# Patient Record
Sex: Female | Born: 1975 | Race: Black or African American | Hispanic: No | Smoking: Current every day smoker
Health system: Southern US, Community
[De-identification: ages and names within clinical notes are randomized; demographics above are authoritative.]

## PROBLEM LIST (undated history)

## (undated) DIAGNOSIS — B1920 Unspecified viral hepatitis C without hepatic coma: Secondary | ICD-10-CM

## (undated) DIAGNOSIS — G4730 Sleep apnea, unspecified: Secondary | ICD-10-CM

## (undated) DIAGNOSIS — F319 Bipolar disorder, unspecified: Secondary | ICD-10-CM

## (undated) DIAGNOSIS — G43909 Migraine, unspecified, not intractable, without status migrainosus: Secondary | ICD-10-CM

## (undated) HISTORY — PX: BACK SURGERY: SHX140

## (undated) HISTORY — DX: Sleep apnea, unspecified: G47.30

## (undated) HISTORY — DX: Bipolar disorder, unspecified: F31.9

## (undated) HISTORY — PX: LEG SURGERY: SHX1003

## (undated) HISTORY — DX: Unspecified viral hepatitis C without hepatic coma: B19.20

## (undated) HISTORY — DX: Migraine, unspecified, not intractable, without status migrainosus: G43.909

---

## 1999-07-27 ENCOUNTER — Emergency Department (HOSPITAL_COMMUNITY): Admission: EM | Admit: 1999-07-27 | Discharge: 1999-07-27 | Payer: Self-pay | Admitting: Emergency Medicine

## 2000-09-29 ENCOUNTER — Emergency Department (HOSPITAL_COMMUNITY): Admission: EM | Admit: 2000-09-29 | Discharge: 2000-09-29 | Payer: Self-pay | Admitting: Emergency Medicine

## 2004-10-25 ENCOUNTER — Emergency Department (HOSPITAL_COMMUNITY): Admission: EM | Admit: 2004-10-25 | Discharge: 2004-10-25 | Payer: Self-pay | Admitting: Emergency Medicine

## 2005-06-25 ENCOUNTER — Emergency Department (HOSPITAL_COMMUNITY): Admission: EM | Admit: 2005-06-25 | Discharge: 2005-06-25 | Payer: Self-pay | Admitting: Emergency Medicine

## 2005-07-02 ENCOUNTER — Emergency Department (HOSPITAL_COMMUNITY): Admission: EM | Admit: 2005-07-02 | Discharge: 2005-07-02 | Payer: Self-pay | Admitting: Emergency Medicine

## 2005-08-15 ENCOUNTER — Emergency Department (HOSPITAL_COMMUNITY): Admission: EM | Admit: 2005-08-15 | Discharge: 2005-08-15 | Payer: Self-pay | Admitting: *Deleted

## 2005-10-15 ENCOUNTER — Emergency Department (HOSPITAL_COMMUNITY): Admission: EM | Admit: 2005-10-15 | Discharge: 2005-10-15 | Payer: Self-pay | Admitting: Emergency Medicine

## 2006-06-19 HISTORY — PX: LEG SURGERY: SHX1003

## 2006-06-19 HISTORY — PX: BACK SURGERY: SHX140

## 2006-11-03 ENCOUNTER — Inpatient Hospital Stay (HOSPITAL_COMMUNITY): Admission: AC | Admit: 2006-11-03 | Discharge: 2006-11-13 | Payer: Self-pay

## 2006-11-13 DIAGNOSIS — S72409A Unspecified fracture of lower end of unspecified femur, initial encounter for closed fracture: Secondary | ICD-10-CM

## 2006-11-13 HISTORY — DX: Unspecified fracture of lower end of unspecified femur, initial encounter for closed fracture: S72.409A

## 2006-12-03 ENCOUNTER — Emergency Department (HOSPITAL_COMMUNITY): Admission: EM | Admit: 2006-12-03 | Discharge: 2006-12-03 | Payer: Self-pay | Admitting: Emergency Medicine

## 2006-12-09 ENCOUNTER — Emergency Department (HOSPITAL_COMMUNITY): Admission: EM | Admit: 2006-12-09 | Discharge: 2006-12-09 | Payer: Self-pay | Admitting: Emergency Medicine

## 2006-12-31 ENCOUNTER — Ambulatory Visit: Payer: Self-pay | Admitting: Internal Medicine

## 2007-01-16 ENCOUNTER — Ambulatory Visit: Payer: Self-pay | Admitting: Internal Medicine

## 2007-01-16 ENCOUNTER — Emergency Department (HOSPITAL_COMMUNITY): Admission: EM | Admit: 2007-01-16 | Discharge: 2007-01-16 | Payer: Self-pay | Admitting: Emergency Medicine

## 2007-02-13 ENCOUNTER — Emergency Department (HOSPITAL_COMMUNITY): Admission: EM | Admit: 2007-02-13 | Discharge: 2007-02-14 | Payer: Self-pay | Admitting: Emergency Medicine

## 2007-03-04 ENCOUNTER — Telehealth (INDEPENDENT_AMBULATORY_CARE_PROVIDER_SITE_OTHER): Payer: Self-pay | Admitting: *Deleted

## 2007-04-29 ENCOUNTER — Emergency Department (HOSPITAL_COMMUNITY): Admission: EM | Admit: 2007-04-29 | Discharge: 2007-04-29 | Payer: Self-pay | Admitting: Emergency Medicine

## 2007-06-04 ENCOUNTER — Telehealth (INDEPENDENT_AMBULATORY_CARE_PROVIDER_SITE_OTHER): Payer: Self-pay | Admitting: *Deleted

## 2007-06-06 ENCOUNTER — Encounter (INDEPENDENT_AMBULATORY_CARE_PROVIDER_SITE_OTHER): Payer: Self-pay | Admitting: Internal Medicine

## 2007-07-11 ENCOUNTER — Emergency Department (HOSPITAL_COMMUNITY): Admission: EM | Admit: 2007-07-11 | Discharge: 2007-07-11 | Payer: Self-pay | Admitting: Emergency Medicine

## 2007-07-12 ENCOUNTER — Ambulatory Visit: Payer: Self-pay | Admitting: Nurse Practitioner

## 2007-07-12 DIAGNOSIS — E669 Obesity, unspecified: Secondary | ICD-10-CM

## 2007-07-12 DIAGNOSIS — K219 Gastro-esophageal reflux disease without esophagitis: Secondary | ICD-10-CM

## 2007-07-12 DIAGNOSIS — F17200 Nicotine dependence, unspecified, uncomplicated: Secondary | ICD-10-CM | POA: Insufficient documentation

## 2007-07-12 DIAGNOSIS — F319 Bipolar disorder, unspecified: Secondary | ICD-10-CM | POA: Insufficient documentation

## 2007-07-12 HISTORY — DX: Gastro-esophageal reflux disease without esophagitis: K21.9

## 2007-07-12 LAB — CONVERTED CEMR LAB
Albumin: 4.3 g/dL (ref 3.5–5.2)
Alkaline Phosphatase: 75 units/L (ref 39–117)
BUN: 10 mg/dL (ref 6–23)
Eosinophils Absolute: 0.1 10*3/uL (ref 0.0–0.7)
Eosinophils Relative: 1 % (ref 0–5)
Glucose, Bld: 76 mg/dL (ref 70–99)
HCT: 41 % (ref 36.0–46.0)
Hemoglobin: 13.4 g/dL (ref 12.0–15.0)
Lymphs Abs: 3.2 10*3/uL (ref 0.7–4.0)
MCV: 88.9 fL (ref 78.0–100.0)
Monocytes Relative: 9 % (ref 3–12)
Potassium: 4.4 meq/L (ref 3.5–5.3)
RBC: 4.61 M/uL (ref 3.87–5.11)
Total Bilirubin: 0.3 mg/dL (ref 0.3–1.2)
WBC: 9.8 10*3/uL (ref 4.0–10.5)

## 2007-07-16 ENCOUNTER — Ambulatory Visit: Payer: Self-pay | Admitting: *Deleted

## 2007-07-16 ENCOUNTER — Encounter (INDEPENDENT_AMBULATORY_CARE_PROVIDER_SITE_OTHER): Payer: Self-pay | Admitting: Nurse Practitioner

## 2007-07-19 ENCOUNTER — Encounter (INDEPENDENT_AMBULATORY_CARE_PROVIDER_SITE_OTHER): Payer: Self-pay | Admitting: Nurse Practitioner

## 2007-08-08 ENCOUNTER — Emergency Department (HOSPITAL_COMMUNITY): Admission: EM | Admit: 2007-08-08 | Discharge: 2007-08-08 | Payer: Self-pay | Admitting: Emergency Medicine

## 2007-08-12 ENCOUNTER — Telehealth (INDEPENDENT_AMBULATORY_CARE_PROVIDER_SITE_OTHER): Payer: Self-pay | Admitting: Nurse Practitioner

## 2007-09-11 ENCOUNTER — Ambulatory Visit: Payer: Self-pay | Admitting: *Deleted
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
## 2007-10-18 LAB — CONVERTED CEMR LAB: Pap Smear: NORMAL
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
## 2008-02-10 ENCOUNTER — Ambulatory Visit: Payer: Self-pay | Admitting: Nurse Practitioner

## 2008-02-10 DIAGNOSIS — I10 Essential (primary) hypertension: Secondary | ICD-10-CM | POA: Insufficient documentation

## 2008-02-10 DIAGNOSIS — K029 Dental caries, unspecified: Secondary | ICD-10-CM | POA: Insufficient documentation

## 2008-02-10 DIAGNOSIS — L659 Nonscarring hair loss, unspecified: Secondary | ICD-10-CM

## 2008-02-10 HISTORY — DX: Dental caries, unspecified: K02.9

## 2008-02-10 HISTORY — DX: Nonscarring hair loss, unspecified: L65.9

## 2008-02-12 ENCOUNTER — Encounter (INDEPENDENT_AMBULATORY_CARE_PROVIDER_SITE_OTHER): Payer: Self-pay | Admitting: Nurse Practitioner

## 2008-03-12 ENCOUNTER — Ambulatory Visit: Payer: Self-pay | Admitting: Nurse Practitioner

## 2008-03-12 DIAGNOSIS — M79609 Pain in unspecified limb: Secondary | ICD-10-CM

## 2008-03-12 HISTORY — DX: Pain in unspecified limb: M79.609

## 2008-03-16 ENCOUNTER — Encounter (INDEPENDENT_AMBULATORY_CARE_PROVIDER_SITE_OTHER): Payer: Self-pay | Admitting: Nurse Practitioner

## 2008-04-07 ENCOUNTER — Telehealth (INDEPENDENT_AMBULATORY_CARE_PROVIDER_SITE_OTHER): Payer: Self-pay | Admitting: Nurse Practitioner

## 2008-05-11 ENCOUNTER — Telehealth (INDEPENDENT_AMBULATORY_CARE_PROVIDER_SITE_OTHER): Payer: Self-pay | Admitting: Nurse Practitioner

## 2008-06-08 ENCOUNTER — Telehealth (INDEPENDENT_AMBULATORY_CARE_PROVIDER_SITE_OTHER): Payer: Self-pay | Admitting: Nurse Practitioner

## 2008-06-15 ENCOUNTER — Encounter (INDEPENDENT_AMBULATORY_CARE_PROVIDER_SITE_OTHER): Payer: Self-pay | Admitting: Nurse Practitioner

## 2008-06-15 ENCOUNTER — Telehealth (INDEPENDENT_AMBULATORY_CARE_PROVIDER_SITE_OTHER): Payer: Self-pay | Admitting: Nurse Practitioner

## 2008-06-29 ENCOUNTER — Emergency Department (HOSPITAL_COMMUNITY): Admission: EM | Admit: 2008-06-29 | Discharge: 2008-06-29 | Payer: Self-pay | Admitting: Emergency Medicine

## 2008-07-06 ENCOUNTER — Encounter (INDEPENDENT_AMBULATORY_CARE_PROVIDER_SITE_OTHER): Payer: Self-pay | Admitting: Nurse Practitioner

## 2008-07-06 ENCOUNTER — Ambulatory Visit: Payer: Self-pay | Admitting: Nurse Practitioner

## 2008-07-06 DIAGNOSIS — N951 Menopausal and female climacteric states: Secondary | ICD-10-CM

## 2008-07-06 DIAGNOSIS — R5383 Other fatigue: Secondary | ICD-10-CM

## 2008-07-06 DIAGNOSIS — R5381 Other malaise: Secondary | ICD-10-CM | POA: Insufficient documentation

## 2008-07-06 DIAGNOSIS — T148XXA Other injury of unspecified body region, initial encounter: Secondary | ICD-10-CM | POA: Insufficient documentation

## 2008-07-06 HISTORY — DX: Menopausal and female climacteric states: N95.1

## 2008-07-07 ENCOUNTER — Encounter (INDEPENDENT_AMBULATORY_CARE_PROVIDER_SITE_OTHER): Payer: Self-pay | Admitting: Nurse Practitioner

## 2008-07-09 ENCOUNTER — Encounter (INDEPENDENT_AMBULATORY_CARE_PROVIDER_SITE_OTHER): Payer: Self-pay | Admitting: Nurse Practitioner

## 2008-07-27 ENCOUNTER — Encounter (INDEPENDENT_AMBULATORY_CARE_PROVIDER_SITE_OTHER): Payer: Self-pay | Admitting: Nurse Practitioner

## 2008-08-06 ENCOUNTER — Emergency Department (HOSPITAL_COMMUNITY): Admission: EM | Admit: 2008-08-06 | Discharge: 2008-08-06 | Payer: Self-pay | Admitting: Emergency Medicine

## 2008-08-10 ENCOUNTER — Telehealth (INDEPENDENT_AMBULATORY_CARE_PROVIDER_SITE_OTHER): Payer: Self-pay | Admitting: Nurse Practitioner

## 2008-08-23 ENCOUNTER — Emergency Department (HOSPITAL_COMMUNITY): Admission: EM | Admit: 2008-08-23 | Discharge: 2008-08-23 | Payer: Self-pay | Admitting: Emergency Medicine

## 2008-08-31 ENCOUNTER — Emergency Department (HOSPITAL_COMMUNITY): Admission: EM | Admit: 2008-08-31 | Discharge: 2008-08-31 | Payer: Self-pay | Admitting: Emergency Medicine

## 2008-09-14 ENCOUNTER — Telehealth (INDEPENDENT_AMBULATORY_CARE_PROVIDER_SITE_OTHER): Payer: Self-pay | Admitting: Nurse Practitioner

## 2008-10-06 ENCOUNTER — Emergency Department (HOSPITAL_COMMUNITY): Admission: EM | Admit: 2008-10-06 | Discharge: 2008-10-07 | Payer: Self-pay | Admitting: Emergency Medicine

## 2008-10-12 ENCOUNTER — Telehealth (INDEPENDENT_AMBULATORY_CARE_PROVIDER_SITE_OTHER): Payer: Self-pay | Admitting: Nurse Practitioner

## 2008-11-08 ENCOUNTER — Emergency Department (HOSPITAL_COMMUNITY): Admission: EM | Admit: 2008-11-08 | Discharge: 2008-11-08 | Payer: Self-pay | Admitting: Emergency Medicine

## 2008-11-12 ENCOUNTER — Ambulatory Visit: Payer: Self-pay | Admitting: Nurse Practitioner

## 2008-11-19 ENCOUNTER — Emergency Department (HOSPITAL_COMMUNITY): Admission: EM | Admit: 2008-11-19 | Discharge: 2008-11-20 | Payer: Self-pay | Admitting: Emergency Medicine

## 2008-12-08 ENCOUNTER — Telehealth (INDEPENDENT_AMBULATORY_CARE_PROVIDER_SITE_OTHER): Payer: Self-pay | Admitting: Nurse Practitioner

## 2009-01-01 ENCOUNTER — Encounter (INDEPENDENT_AMBULATORY_CARE_PROVIDER_SITE_OTHER): Payer: Self-pay | Admitting: Nurse Practitioner

## 2009-01-05 ENCOUNTER — Telehealth (INDEPENDENT_AMBULATORY_CARE_PROVIDER_SITE_OTHER): Payer: Self-pay | Admitting: Nurse Practitioner

## 2009-01-06 ENCOUNTER — Emergency Department (HOSPITAL_COMMUNITY): Admission: EM | Admit: 2009-01-06 | Discharge: 2009-01-06 | Payer: Self-pay | Admitting: Emergency Medicine

## 2009-01-24 ENCOUNTER — Emergency Department (HOSPITAL_COMMUNITY): Admission: EM | Admit: 2009-01-24 | Discharge: 2009-01-25 | Payer: Self-pay | Admitting: Emergency Medicine

## 2009-02-08 ENCOUNTER — Telehealth (INDEPENDENT_AMBULATORY_CARE_PROVIDER_SITE_OTHER): Payer: Self-pay | Admitting: Nurse Practitioner

## 2009-02-09 ENCOUNTER — Telehealth (INDEPENDENT_AMBULATORY_CARE_PROVIDER_SITE_OTHER): Payer: Self-pay | Admitting: *Deleted

## 2009-03-02 ENCOUNTER — Encounter (INDEPENDENT_AMBULATORY_CARE_PROVIDER_SITE_OTHER): Payer: Self-pay | Admitting: *Deleted

## 2009-03-09 ENCOUNTER — Telehealth (INDEPENDENT_AMBULATORY_CARE_PROVIDER_SITE_OTHER): Payer: Self-pay | Admitting: Nurse Practitioner

## 2009-03-16 ENCOUNTER — Encounter (INDEPENDENT_AMBULATORY_CARE_PROVIDER_SITE_OTHER): Payer: Self-pay | Admitting: Nurse Practitioner

## 2009-03-22 ENCOUNTER — Encounter (INDEPENDENT_AMBULATORY_CARE_PROVIDER_SITE_OTHER): Payer: Self-pay | Admitting: Nurse Practitioner

## 2009-04-08 ENCOUNTER — Telehealth (INDEPENDENT_AMBULATORY_CARE_PROVIDER_SITE_OTHER): Payer: Self-pay | Admitting: Nurse Practitioner

## 2009-04-09 ENCOUNTER — Ambulatory Visit: Payer: Self-pay | Admitting: Nurse Practitioner

## 2009-04-09 DIAGNOSIS — J069 Acute upper respiratory infection, unspecified: Secondary | ICD-10-CM | POA: Insufficient documentation

## 2009-04-12 ENCOUNTER — Telehealth (INDEPENDENT_AMBULATORY_CARE_PROVIDER_SITE_OTHER): Payer: Self-pay | Admitting: Nurse Practitioner

## 2009-04-15 ENCOUNTER — Emergency Department (HOSPITAL_COMMUNITY): Admission: EM | Admit: 2009-04-15 | Discharge: 2009-04-15 | Payer: Self-pay | Admitting: Emergency Medicine

## 2009-05-10 ENCOUNTER — Telehealth (INDEPENDENT_AMBULATORY_CARE_PROVIDER_SITE_OTHER): Payer: Self-pay | Admitting: Nurse Practitioner

## 2009-05-20 ENCOUNTER — Emergency Department (HOSPITAL_COMMUNITY): Admission: EM | Admit: 2009-05-20 | Discharge: 2009-05-20 | Payer: Self-pay | Admitting: Emergency Medicine

## 2009-06-03 ENCOUNTER — Telehealth (INDEPENDENT_AMBULATORY_CARE_PROVIDER_SITE_OTHER): Payer: Self-pay | Admitting: Nurse Practitioner

## 2009-06-07 ENCOUNTER — Telehealth (INDEPENDENT_AMBULATORY_CARE_PROVIDER_SITE_OTHER): Payer: Self-pay | Admitting: Nurse Practitioner

## 2009-06-08 ENCOUNTER — Encounter (INDEPENDENT_AMBULATORY_CARE_PROVIDER_SITE_OTHER): Payer: Self-pay | Admitting: Nurse Practitioner

## 2009-07-05 ENCOUNTER — Ambulatory Visit: Payer: Self-pay | Admitting: Nurse Practitioner

## 2009-07-05 DIAGNOSIS — B351 Tinea unguium: Secondary | ICD-10-CM | POA: Insufficient documentation

## 2009-07-05 DIAGNOSIS — M549 Dorsalgia, unspecified: Secondary | ICD-10-CM

## 2009-07-05 HISTORY — DX: Tinea unguium: B35.1

## 2009-07-05 HISTORY — DX: Dorsalgia, unspecified: M54.9

## 2009-07-05 LAB — CONVERTED CEMR LAB
Blood in Urine, dipstick: NEGATIVE
Glucose, Urine, Semiquant: NEGATIVE
WBC Urine, dipstick: NEGATIVE
pH: 5

## 2009-07-08 LAB — CONVERTED CEMR LAB
ALT: 21 units/L (ref 0–35)
AST: 21 units/L (ref 0–37)
Albumin: 4.4 g/dL (ref 3.5–5.2)
CO2: 27 meq/L (ref 19–32)
Calcium: 9.3 mg/dL (ref 8.4–10.5)
Chloride: 100 meq/L (ref 96–112)
Eosinophils Absolute: 0.1 10*3/uL (ref 0.0–0.7)
Lymphocytes Relative: 25 % (ref 12–46)
Lymphs Abs: 2.8 10*3/uL (ref 0.7–4.0)
MCV: 90.8 fL (ref 78.0–100.0)
Monocytes Relative: 7 % (ref 3–12)
Neutro Abs: 7.5 10*3/uL (ref 1.7–7.7)
Neutrophils Relative %: 67 % (ref 43–77)
Potassium: 4.6 meq/L (ref 3.5–5.3)
RBC: 4.56 M/uL (ref 3.87–5.11)
Sodium: 137 meq/L (ref 135–145)
TSH: 0.91 microintl units/mL (ref 0.350–4.500)
Total Protein: 7.5 g/dL (ref 6.0–8.3)
WBC: 11.3 10*3/uL — ABNORMAL HIGH (ref 4.0–10.5)

## 2009-07-09 ENCOUNTER — Telehealth (INDEPENDENT_AMBULATORY_CARE_PROVIDER_SITE_OTHER): Payer: Self-pay | Admitting: Nurse Practitioner

## 2009-07-12 ENCOUNTER — Encounter (INDEPENDENT_AMBULATORY_CARE_PROVIDER_SITE_OTHER): Payer: Self-pay | Admitting: Nurse Practitioner

## 2009-08-10 ENCOUNTER — Telehealth (INDEPENDENT_AMBULATORY_CARE_PROVIDER_SITE_OTHER): Payer: Self-pay | Admitting: Nurse Practitioner

## 2009-09-07 ENCOUNTER — Other Ambulatory Visit: Admission: RE | Admit: 2009-09-07 | Discharge: 2009-09-07 | Payer: Self-pay | Admitting: Internal Medicine

## 2009-09-07 ENCOUNTER — Ambulatory Visit: Payer: Self-pay | Admitting: Nurse Practitioner

## 2009-09-07 DIAGNOSIS — R3915 Urgency of urination: Secondary | ICD-10-CM | POA: Insufficient documentation

## 2009-09-07 DIAGNOSIS — N926 Irregular menstruation, unspecified: Secondary | ICD-10-CM

## 2009-09-07 HISTORY — DX: Urgency of urination: R39.15

## 2009-09-07 HISTORY — DX: Irregular menstruation, unspecified: N92.6

## 2009-09-07 LAB — CONVERTED CEMR LAB
Blood in Urine, dipstick: NEGATIVE
KOH Prep: NEGATIVE
Nitrite: NEGATIVE
Protein, U semiquant: 30
WBC Urine, dipstick: NEGATIVE

## 2009-09-08 ENCOUNTER — Encounter (INDEPENDENT_AMBULATORY_CARE_PROVIDER_SITE_OTHER): Payer: Self-pay | Admitting: Nurse Practitioner

## 2009-09-08 LAB — CONVERTED CEMR LAB
Eosinophils Absolute: 0.1 10*3/uL (ref 0.0–0.7)
GC Probe Amp, Genital: NEGATIVE
HCT: 38.8 % (ref 36.0–46.0)
HDL: 36 mg/dL — ABNORMAL LOW (ref >39)
Lymphocytes Relative: 25 % (ref 12–46)
Lymphs Abs: 4.7 10*3/uL — ABNORMAL HIGH (ref 0.7–4.0)
MCV: 90 fL (ref 78.0–100.0)
Monocytes Relative: 9 % (ref 3–12)
Neutrophils Relative %: 65 % (ref 43–77)
RBC: 4.31 M/uL (ref 3.87–5.11)
Total CHOL/HDL Ratio: 7.7
Triglycerides: 169 mg/dL — ABNORMAL HIGH (ref <150)
WBC: 18.8 10*3/uL — ABNORMAL HIGH (ref 4.0–10.5)

## 2009-09-09 LAB — CONVERTED CEMR LAB: Pap Smear: NEGATIVE

## 2009-09-13 ENCOUNTER — Encounter (INDEPENDENT_AMBULATORY_CARE_PROVIDER_SITE_OTHER): Payer: Self-pay | Admitting: Nurse Practitioner

## 2009-09-13 ENCOUNTER — Telehealth (INDEPENDENT_AMBULATORY_CARE_PROVIDER_SITE_OTHER): Payer: Self-pay | Admitting: Nurse Practitioner

## 2009-09-13 DIAGNOSIS — D72829 Elevated white blood cell count, unspecified: Secondary | ICD-10-CM | POA: Insufficient documentation

## 2009-09-14 ENCOUNTER — Encounter (INDEPENDENT_AMBULATORY_CARE_PROVIDER_SITE_OTHER): Payer: Self-pay | Admitting: *Deleted

## 2009-09-15 ENCOUNTER — Telehealth (INDEPENDENT_AMBULATORY_CARE_PROVIDER_SITE_OTHER): Payer: Self-pay | Admitting: Nurse Practitioner

## 2009-10-06 ENCOUNTER — Telehealth (INDEPENDENT_AMBULATORY_CARE_PROVIDER_SITE_OTHER): Payer: Self-pay | Admitting: Nurse Practitioner

## 2009-10-25 ENCOUNTER — Telehealth (INDEPENDENT_AMBULATORY_CARE_PROVIDER_SITE_OTHER): Payer: Self-pay | Admitting: Nurse Practitioner

## 2009-10-26 ENCOUNTER — Emergency Department (HOSPITAL_COMMUNITY): Admission: EM | Admit: 2009-10-26 | Discharge: 2009-10-26 | Payer: Self-pay | Admitting: Emergency Medicine

## 2009-11-02 ENCOUNTER — Telehealth (INDEPENDENT_AMBULATORY_CARE_PROVIDER_SITE_OTHER): Payer: Self-pay | Admitting: Nurse Practitioner

## 2009-11-05 ENCOUNTER — Ambulatory Visit: Payer: Self-pay | Admitting: Nurse Practitioner

## 2009-11-10 LAB — CONVERTED CEMR LAB
HCT: 39.1 % (ref 36.0–46.0)
Hemoglobin: 12.4 g/dL (ref 12.0–15.0)
MCHC: 31.7 g/dL (ref 30.0–36.0)
MCV: 93.3 fL (ref 78.0–100.0)
RBC: 4.19 M/uL (ref 3.87–5.11)
RDW: 14.8 % (ref 11.5–15.5)

## 2009-11-11 ENCOUNTER — Encounter (INDEPENDENT_AMBULATORY_CARE_PROVIDER_SITE_OTHER): Payer: Self-pay | Admitting: Nurse Practitioner

## 2009-12-03 ENCOUNTER — Ambulatory Visit: Payer: Self-pay | Admitting: Nurse Practitioner

## 2009-12-03 DIAGNOSIS — G479 Sleep disorder, unspecified: Secondary | ICD-10-CM | POA: Insufficient documentation

## 2009-12-03 DIAGNOSIS — E7800 Pure hypercholesterolemia, unspecified: Secondary | ICD-10-CM

## 2009-12-03 LAB — CONVERTED CEMR LAB
Blood Glucose, Fingerstick: 83
HDL goal, serum: 40 mg/dL

## 2009-12-06 LAB — CONVERTED CEMR LAB
ALT: 13 units/L (ref 0–35)
AST: 16 units/L (ref 0–37)
Albumin: 4.4 g/dL (ref 3.5–5.2)
Bilirubin, Direct: <0.1 mg/dL (ref 0.0–0.3)
Cholesterol: 211 mg/dL — ABNORMAL HIGH (ref 0–200)
HDL: 38 mg/dL — ABNORMAL LOW (ref >39)
Total CHOL/HDL Ratio: 5.6
Total Protein: 7.4 g/dL (ref 6.0–8.3)
Triglycerides: 326 mg/dL — ABNORMAL HIGH (ref <150)
VLDL: 65 mg/dL — ABNORMAL HIGH (ref 0–40)

## 2009-12-07 ENCOUNTER — Encounter (INDEPENDENT_AMBULATORY_CARE_PROVIDER_SITE_OTHER): Payer: Self-pay | Admitting: *Deleted

## 2009-12-24 ENCOUNTER — Emergency Department (HOSPITAL_COMMUNITY): Admission: EM | Admit: 2009-12-24 | Discharge: 2009-12-25 | Payer: Self-pay | Admitting: Emergency Medicine

## 2009-12-30 ENCOUNTER — Telehealth (INDEPENDENT_AMBULATORY_CARE_PROVIDER_SITE_OTHER): Payer: Self-pay | Admitting: Nurse Practitioner

## 2010-01-12 ENCOUNTER — Encounter (INDEPENDENT_AMBULATORY_CARE_PROVIDER_SITE_OTHER): Payer: Self-pay | Admitting: Nurse Practitioner

## 2010-01-19 ENCOUNTER — Emergency Department (HOSPITAL_COMMUNITY): Admission: EM | Admit: 2010-01-19 | Discharge: 2010-01-19 | Payer: Self-pay | Admitting: Emergency Medicine

## 2010-01-27 ENCOUNTER — Encounter (INDEPENDENT_AMBULATORY_CARE_PROVIDER_SITE_OTHER): Payer: Self-pay | Admitting: Nurse Practitioner

## 2010-02-01 ENCOUNTER — Telehealth (INDEPENDENT_AMBULATORY_CARE_PROVIDER_SITE_OTHER): Payer: Self-pay | Admitting: Nurse Practitioner

## 2010-03-03 ENCOUNTER — Telehealth (INDEPENDENT_AMBULATORY_CARE_PROVIDER_SITE_OTHER): Payer: Self-pay | Admitting: Nurse Practitioner

## 2010-04-01 ENCOUNTER — Telehealth (INDEPENDENT_AMBULATORY_CARE_PROVIDER_SITE_OTHER): Payer: Self-pay | Admitting: Nurse Practitioner

## 2010-04-11 ENCOUNTER — Telehealth (INDEPENDENT_AMBULATORY_CARE_PROVIDER_SITE_OTHER): Payer: Self-pay | Admitting: Nurse Practitioner

## 2010-04-13 ENCOUNTER — Ambulatory Visit: Payer: Self-pay | Admitting: Nurse Practitioner

## 2010-04-13 DIAGNOSIS — H6690 Otitis media, unspecified, unspecified ear: Secondary | ICD-10-CM

## 2010-04-13 HISTORY — DX: Otitis media, unspecified, unspecified ear: H66.90

## 2010-04-14 ENCOUNTER — Encounter (INDEPENDENT_AMBULATORY_CARE_PROVIDER_SITE_OTHER): Payer: Self-pay | Admitting: Nurse Practitioner

## 2010-04-14 LAB — CONVERTED CEMR LAB
ALT: 18 units/L (ref 0–35)
Alkaline Phosphatase: 74 units/L (ref 39–117)
Bilirubin, Direct: <0.1 mg/dL (ref 0.0–0.3)
Cholesterol: 266 mg/dL — ABNORMAL HIGH (ref 0–200)
Total Protein: 7.4 g/dL (ref 6.0–8.3)
Triglycerides: 193 mg/dL — ABNORMAL HIGH (ref <150)
VLDL: 39 mg/dL (ref 0–40)

## 2010-05-02 ENCOUNTER — Telehealth (INDEPENDENT_AMBULATORY_CARE_PROVIDER_SITE_OTHER): Payer: Self-pay | Admitting: Nurse Practitioner

## 2010-06-28 ENCOUNTER — Telehealth (INDEPENDENT_AMBULATORY_CARE_PROVIDER_SITE_OTHER): Payer: Self-pay | Admitting: Nurse Practitioner

## 2010-07-10 ENCOUNTER — Encounter: Payer: Self-pay | Admitting: Internal Medicine

## 2010-07-19 ENCOUNTER — Encounter (INDEPENDENT_AMBULATORY_CARE_PROVIDER_SITE_OTHER): Payer: Self-pay | Admitting: Nurse Practitioner

## 2010-07-19 NOTE — Progress Notes (Signed)
Summary: REFILL ON VICODEN  Phone Note Call from Patient Call back at Home Phone [phone]   Reason for Call: Refill Medication Summary of Call: [name]. MS HARRIS IS CALLING IN FOR HER VICODEN SCRIPT TO BE PICKED UP ON FRIDAY. Initial call taken by: Leodis Rains,  Nov 02, 2009 12:26 PM  Follow-up for Phone Call        forward to N. Daphine Deutscher, FNP last filled 10/06/2009 Follow-up by: Levon Hedger,  Nov 02, 2009 3:22 PM  Additional Follow-up for Phone Call Additional follow up Details #1::        see previous phone note -- i have been trying to reach this pt regarding labs - be sure she gets the information in previously note (looks like pt was left a message to call office back) she needs to know that she will get labs done on Friday (so she will need a lab appt) at that time she can get her medication refills Additional Follow-up by: Lehman Prom FNP,  Nov 02, 2009 3:30 PM    Additional Follow-up for Phone Call Additional follow up Details #2::    PT INFORMED AND WILL BE IN ON 11/05/09 FOR LAB VISIT AND TO PICK UP RX Follow-up by: Levon Hedger,  Nov 03, 2009 12:45 PM

## 2010-07-19 NOTE — Miscellaneous (Signed)
Summary: Addition of codes  Clinical Lists Changes Added the following code for lab coverage  Problems: Added new problem of ENCOUNTER FOR LONG-TERM USE OF OTHER MEDICATIONS (ICD-V58.69)
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
## 2010-07-19 NOTE — Progress Notes (Signed)
Summary: MEDICATION DUE TOMORROW  Phone Note Call from Patient Call back at Home Phone [phone]   Reason for Call: Refill Medication Summary of Call: [name]. MS HARRIS IS CALLING IN FOR HER HYDROCODONE, ZOLIPIDEM AND BP MEDICATION. THATS DUE TOMORROW, SHE USES [facility]. Initial call taken by: Leodis Rains,  April 01, 2010 8:24 AM  Follow-up for Phone Call        Rx printed and at station [place]  to fax back Follow-up by: Lehman Prom FNP,  April 01, 2010 3:52 PM    Prescriptions: SOMA 350 MG  TABS (CARISOPRODOL) 1 tablet by mouth nightly as needed for muscles  #30 x 3   Entered and Authorized by:   Lehman Prom FNP   Signed by:   Lehman Prom FNP on 04/01/2010   Method used:   Printed then faxed to ...       [facility] [phone]* (retail)       [address]       [address]       [place], [place]  [address]       Ph: [phone]       Fax: [phone]   RxID:   [id number] HYDROCODONE-ACETAMINOPHEN 7.5-500 MG TABS (HYDROCODONE-ACETAMINOPHEN) One tablet by mouth two times a day as needed for pain  #60 x 0   Entered and Authorized by:   Lehman Prom FNP   Signed by:   Lehman Prom FNP on 04/01/2010   Method used:   Printed then faxed to ...       [facility] [phone]* (retail)       [address]       [address]       [place], [place]  [address]       Ph: [phone]       Fax: [phone]   RxID:   [id number]

## 2010-07-19 NOTE — Letter (Signed)
Summary: Generic Letter  [facility]  [address] [place], [place] [address]   Phone: [phone]  Fax: [phone]    09/07/2009  Holly Benjamin [address] [place], [place]  [address]  To whom it may concern:  Ms. Holly Benjamin was seen in this office today.  She will have further work-up done to determine why she is having trouble voiding.  She has been encouraged to keep the appointment to have a diagnostic study done and she will be notified of the results.     Sincerely,   Lehman Prom FNP [facility]

## 2010-07-19 NOTE — Assessment & Plan Note (Signed)
Summary: Complete Physical Exam   Vital Signs:  Patient profile:   35 year old female LMP:     08/2009 Weight:      264.3 pounds BMI:     46.99 BSA:     2.18 Temp:     97.3 degrees F oral Pulse rate:   96 / minute Pulse rhythm:   regular Resp:     20 per minute BP sitting:   147 / 91  (left arm) Cuff size:   large  Vitals Entered By: Levon Hedger (September 07, 2009 10:33 AM) CC: CPP...has been having migraine headache, Hypertension Management, Abdominal Pain Is Patient Diabetic? No Pain Assessment Patient in pain? no       Does patient need assistance? Functional Status Self care Ambulation Normal LMP (date): 08/2009 LMP - Character: normal     Enter LMP: 08/2009 Last PAP Result Normal   CC:  CPP...has been having migraine headache, Hypertension Management, and Abdominal Pain.  History of Present Illness:  Pt into the office for a complete physical exam  PAP - last done in 2009. All previous pap smears have been normal.  Menses - irregular  Mammogram - never had mammogram.   Maternal grandmother with breast cancer (deceased) no self breast exams at home  optho - no glasses or contacts.  Dental - no current dental appt.  last appt was in 2009  Obesity - pt has lost 12 pounds since her last visit.  She has been trying to lose weight  Dyspepsia History:      She has no alarm features of dyspepsia including no history of melena, hematochezia, dysphagia, persistent vomiting, or involuntary weight loss > 5%.  There is a prior history of GERD.  The patient does not have a prior history of documented ulcer disease.  The dominant symptom is heartburn or acid reflux.  An H-2 blocker medication is not currently being taken.  She has no history of a positive H. Pylori serology.  No previous upper endoscopy has been done.    Hypertension History:      She denies headache, chest pain, and palpitations.  She notes no problems with any antihypertensive medication side  effects.        Positive major cardiovascular risk factors include hypertension and current tobacco user.  Negative major cardiovascular risk factors include female age less than 75 years old and no history of diabetes or hyperlipidemia.        Further assessment for target organ damage reveals no history of ASHD, cardiac end-organ damage (CHF/LVH), stroke/TIA, peripheral vascular disease, renal insufficiency, or hypertensive retinopathy.     Habits & Providers  Alcohol-Tobacco-Diet     Alcohol drinks/day: 0     Tobacco Status: current     Tobacco Counseling: to remain off tobacco products     Cigarette Packs/Day: 0.5  Exercise-Depression-Behavior     Does Patient Exercise: yes     Exercise Counseling: to improve exercise regimen     Type of exercise: walking     Exercise (avg: min/session): <30     Times/week: 3     Have you felt down or hopeless? yes     Have you felt little pleasure in things? yes     Drug Use: no  Allergies (verified): No Known Drug Allergies  Review of Systems General:  Denies fever. Eyes:  Denies blurring. ENT:  Denies earache. CV:  Denies chest pain or discomfort. Resp:  Denies cough. GI:  Complains of indigestion; denies  abdominal pain, nausea, and vomiting; omeprazole use very infrequent. GU:  c/o trouble voiding at times.  urge but when she goes to the bathroom no urine will flow.. MS:  Complains of joint pain and low back pain. Derm:  Denies rash. Neuro:  Complains of headaches. Psych:  Complains of anxiety and depression.  Physical Exam  General:  alert.   Head:  normocephalic.   Eyes:  vision grossly intact, pupils equal, and pupils round.   Ears:  bil TM with bony landmarks present Nose:  no nasal discharge.   Mouth:  pharynx pink and moist.   Neck:  supple.   Breasts:  skin/areolae normal, no masses, and no abnormal thickening.   Lungs:  normal breath sounds.   Heart:  normal rate and regular rhythm.   Abdomen:  soft, non-tender, and  normal bowel sounds.   Rectal:  no external abnormalities.   Msk:  up to the exam table Pulses:  R radial normal, R dorsalis pedis normal, L radial normal, and L dorsalis pedis normal.   Extremities:  no edema Neurologic:  alert & oriented X3.   Skin:  multiple tattoos over body Psych:  Oriented X3.  anxious (more so than in previous office visits)  Pelvic Exam  Vulva:      normal appearance.   Urethra and Bladder:      Urethra--no discharge.   Vagina:      physiologic discharge.   Cervix:      midposition.   Adnexa:      nontender bilaterally.   Rectum:      normal.      Impression & Recommendations:  Problem # 1:  ROUTINE GYNECOLOGICAL EXAMINATION (ICD-V72.31) labs reviewed PAP done self breast exam recommended routine optho and dental exams recommended Orders: T- GC Chlamydia (21308) KOH/ WET Mount [phone]) Pap Smear, Thin Prep ( Collection of) (O9629)  Problem # 2:  IRREGULAR MENSES (ICD-626.4) will check transvaginal u/s Orders: Ultrasound (Ultrasound)  Problem # 3:  URINARY URGENCY (BMW-413.24) u/a done will check u/s Orders: Ultrasound (Ultrasound)  Problem # 4:  BACK PAIN (ICD-724.5)  Her updated medication list for this problem includes:    Hydrocodone-acetaminophen 7.5-500 Mg Tabs (Hydrocodone-acetaminophen) ..... One tablet by mouth two times a day as needed for pain    Soma 350 Mg Tabs (Carisoprodol) .Marland Kitchen... 1 tablet by mouth nightly as needed for muscles    Meloxicam 15 Mg Tabs (Meloxicam) ..... One tablet by mouth daily for back  Problem # 5:  HYPERTENSION, BENIGN ESSENTIAL (ICD-401.1) DASH diet stable - continue curernt meds Her updated medication list for this problem includes:    Benazepril Hcl 10 Mg Tabs (Benazepril hcl) ..... One tablet by mouth daily for blood pressure  Orders: UA Dipstick w/o Micro (manual) (40102)  Problem # 6:  OBESITY (ICD-278.00) congrats on weight loss, better for overall health Orders: T-Lipid Profile  ([id number]) T-CBC w/Diff ([id number])  Problem # 7:  BIPOLAR AFFECTIVE DISORDER (ICD-296.80) continue f/u at the [facility]  Complete Medication List: 1)  Hydrocodone-acetaminophen 7.5-500 Mg Tabs (Hydrocodone-acetaminophen) .... One tablet by mouth two times a day as needed for pain 2)  Soma 350 Mg Tabs (Carisoprodol) .Marland Kitchen.. 1 tablet by mouth nightly as needed for muscles 3)  Omeprazole 20 Mg Cpdr (Omeprazole) .Marland Kitchen.. 1 capsule by mouth 30 minutes before breakfast and 30 minutes before dinner 4)  Meloxicam 15 Mg Tabs (Meloxicam) .... One tablet by mouth daily for back 5)  Seroquel 300 Mg Tabs (Quetiapine fumarate) .Marland KitchenMarland KitchenMarland Kitchen  1 tablet by mouth nightly **gx [facility]** 6)  Cymbalta 60 Mg Cpep (Duloxetine hcl) .Marland Kitchen.. 1 tablet by mouth daily **rx by [facility]** 7)  Klonopin 1 Mg Tabs (Clonazepam) .... Rx by the [facility] 8)  Triamcinolone Acetonide 0.1 % Oint (Triamcinolone acetonide) .Marland Kitchen.. 1 application topically to affected area two times a day 9)  Benazepril Hcl 10 Mg Tabs (Benazepril hcl) .... One tablet by mouth daily for blood pressure  Hypertension Assessment/Plan:      The patient's hypertensive risk group is category B: At least one risk factor (excluding diabetes) with no target organ damage.  Today's blood pressure is 147/91.  Her blood pressure goal is < 140/90.   Patient Instructions: 1)  You will be informed of any abnormal labs. 2)  Urine and PAP will be checked for infection. 3)  Congrats on your weight loss. 4)  Keep the appointment for abdominal and pelvic ultrasound.  you will be notified of the results. 5)  At next visit if blood pressure is still up willl need to increase meds. 6)  Will need urine drug screen Prescriptions: HYDROCODONE-ACETAMINOPHEN 7.5-500 MG TABS (HYDROCODONE-ACETAMINOPHEN) One tablet by mouth two times a day as needed for pain  #60 x 0   Entered and Authorized by:   Lehman Prom FNP   Signed by:   Lehman Prom FNP on 09/07/2009    Method used:   Print then Give to Patient   RxID:   [id number]   Prevention & Chronic Care Immunizations   Influenza vaccine: Fluvax 3+  (07/05/2009)    Tetanus booster: 06/19/2006: historical per pt    Pneumococcal vaccine: Not documented  Other Screening   Pap smear: Normal  (10/18/2007)   Smoking status: current  (09/07/2009)   Smoking cessation counseling: yes  (07/06/2008)  Hypertension   Last Blood Pressure: 147 / 91  (09/07/2009)   Serum creatinine: 0.75  (07/05/2009)   Serum potassium 4.6  (07/05/2009)  Self-Management Support :    Hypertension self-management support: Not documented  Laboratory Results   Urine Tests  Date/Time Received: September 07, 2009 11:44 AM   Routine Urinalysis   Color: yellow Glucose: negative   (Normal Range: Negative) Bilirubin: negative   (Normal Range: Negative) Ketone: moderate (40)   (Normal Range: Negative) Spec. Gravity: >=1.030   (Normal Range: 1.003-1.035) Blood: negative   (Normal Range: Negative) pH: 5.0   (Normal Range: 5.0-8.0) Protein: 30   (Normal Range: Negative) Urobilinogen: 0.2   (Normal Range: 0-1) Nitrite: negative   (Normal Range: Negative) Leukocyte Esterace: negative   (Normal Range: Negative)      Wet Mount Source: vaginal WBC/hpf: 1-5 Bacteria/hpf: rare Clue cells/hpf: none Yeast/hpf: none Wet [place] KOH: Negative Trichomonas/hpf: none

## 2010-07-19 NOTE — Assessment & Plan Note (Signed)
Summary: Acute - Otitis Media   Vital Signs:  Patient profile:   35 year old female LMP:     03/2010 Weight:      272.6 pounds BMI:     48.46 Temp:     97.6 degrees F oral Pulse rate:   100 / minute Pulse rhythm:   regular Resp:     20 per minute BP sitting:   150 / 80  (left arm) Cuff size:   large  Vitals Entered By: Levon Hedger (April 13, 2010 9:27 AM)  Nutrition Counseling: Patient's BMI is greater than 25 and therefore counseled on weight management options. CC: pt has two holes in her teeth and she has been having trouble with her ear it is stopped up and pain x 1 week Is Patient Diabetic? No Pain Assessment Patient in pain? yes     Location: ear, teeth  Does patient need assistance? Functional Status Self care Ambulation Normal LMP (date): 03/2010 LMP - Character: normal     Enter LMP: 03/2010 Last PAP Result  Specimen Adequacy: Satisfactory for evaluation.   Interpretation/Result:Negative for intraepithelial Lesion or Malignancy.      CC:  pt has two holes in her teeth and she has been having trouble with her ear it is stopped up and pain x 1 week.  History of Present Illness:  Pt into the office with c/o dental pain. Started 2 weeks ago Right upper mouth - 2 teeth that need extraction. She has an appt on next week already scheduled.  Hearing problems Unable to hear out of the right ear and left ear is decreased some scant drainage out of the right ear -fever +decreased appetite but that may be due to dental problems Chronic ear infections during childhood  Pt is on chronic meds for back. She last got Rx on 04/01/2010 but she reports that she has already taken all her pain medications.  Allergies (verified): No Known Drug Allergies  Review of Systems General:  +dental problems: right upper  molar. ENT:  Complains of decreased hearing and earache; +right ear. CV:  Denies chest pain or discomfort. Resp:  Denies cough. GI:  Denies abdominal  pain, nausea, and vomiting. MS:  chronic back pain. Neuro:  Denies headaches.  Physical Exam  General:  alert.  sick appearing Head:  normocephalic.   Ears:  ear piercing(s) noted.   Bil TM with erythema L>R left ear with clear fluid in canal tenderness to tragus Mouth:  poor dentition.   Lungs:  normal breath sounds.   Heart:  normal rate and regular rhythm.   Msk:  up to the exam table Neurologic:  alert & oriented X3.   Skin:  color normal.   Psych:  Oriented X3.     Impression & Recommendations:  Problem # 1:  OTITIS MEDIA, BILATERAL (ICD-382.9) advisd pt to take all of antibiotic Dx given to pt Her updated medication list for this problem includes:    Meloxicam 15 Mg Tabs (Meloxicam) ..... One tablet by mouth daily for back    Augmentin 875-125 Mg Tabs (Amoxicillin-pot clavulanate) ..... One tablet by mouth two times a day for infection  Problem # 2:  HYPERCHOLESTEROLEMIA (ICD-272.0) Will check labs today Her updated medication list for this problem includes:    Pravastatin Sodium 40 Mg Tabs (Pravastatin sodium) ..... One tablet by mouth nightly for cholesterol  **note change in dose**  Orders: T-Lipid Profile (16109-60454) T-Hepatic Function (09811-91478)  Problem # 3:  NEED PROPHYLACTIC VACCINATION&INOCULATION FLU (  ICD-V04.81) given today in office  Complete Medication List: 1)  Hydrocodone-acetaminophen 7.5-500 Mg Tabs (Hydrocodone-acetaminophen) .... One tablet by mouth two times a day as needed for pain 2)  Soma 350 Mg Tabs (Carisoprodol) .Marland Kitchen.. 1 tablet by mouth nightly as needed for muscles 3)  Omeprazole 20 Mg Cpdr (Omeprazole) .Marland Kitchen.. 1 capsule by mouth 30 minutes before breakfast and 30 minutes before dinner 4)  Meloxicam 15 Mg Tabs (Meloxicam) .... One tablet by mouth daily for back 5)  Seroquel 300 Mg Tabs (Quetiapine fumarate) .Marland Kitchen.. 1 tablet by mouth nightly **gx [facility]** 6)  Cymbalta 60 Mg Cpep (Duloxetine hcl) .Marland Kitchen.. 1 tablet by mouth daily **rx by  [facility]** 7)  Triamcinolone Acetonide 0.1 % Oint (Triamcinolone acetonide) .Marland Kitchen.. 1 application topically to affected area two times a day 8)  Benazepril Hcl 10 Mg Tabs (Benazepril hcl) .... One tablet by mouth daily for blood pressure 9)  Pravastatin Sodium 40 Mg Tabs (Pravastatin sodium) .... One tablet by mouth nightly for cholesterol  **note change in dose** 10)  Zolpidem Tartrate 10 Mg Tabs (Zolpidem tartrate) .... One tablet by mouth nightly as needed for rest 11)  Augmentin 875-125 Mg Tabs (Amoxicillin-pot clavulanate) .... One tablet by mouth two times a day for infection 12)  Tylenol With Codeine #3 300-30 Mg Tabs (Acetaminophen-codeine) .... One tablet by mouth two times a day as needed for pain  Other Orders: Flu Vaccine 69yrs + (16109) Admin 1st Vaccine (60454)  Patient Instructions: 1)  Bilateral ear infection - Take all the antibiotics as ordered. 2)  Do not stick anything in your ear since your ear drums have infection and fluid behind them.   Do not want to risk busting your ear drum. 3)  Take Tylenol #3 for pain as needed  4)  You can also alternate with aleve as needed for inflammation 5)  You have been given the flu vaccine today. 6)  Blood pressure - elevated today.  This may be due to pain.  Will monitor on next visit 7)  Follow up as needed Prescriptions: TYLENOL WITH CODEINE #3 300-30 MG TABS (ACETAMINOPHEN-CODEINE) One tablet by mouth two times a day as needed for pain  #30 x 0   Entered and Authorized by:   Lehman Prom FNP   Signed by:   Lehman Prom FNP on 04/13/2010   Method used:   Print then Give to Patient   RxID:   [id number] AUGMENTIN 875-125 MG TABS (AMOXICILLIN-POT CLAVULANATE) One tablet by mouth two times a day for infection  #20 x 0   Entered and Authorized by:   Lehman Prom FNP   Signed by:   Lehman Prom FNP on 04/13/2010   Method used:   Print then Give to Patient   RxID:   [id number]    Orders Added: 1)  Flu  Vaccine 32yrs + [41324] 2)  Admin 1st Vaccine [90471] 3)  Est. Patient Level III [40102] 4)  T-Lipid Profile [80061-22930] 5)  T-Hepatic Function [72536-64403]   Immunizations Administered:  Influenza Vaccine # 1:    Vaccine Type: Fluvax 3+    Site: left deltoid    Mfr: GlaxoSmithKline    Dose: 0.5 ml    Route: IM    Given by: Levon Hedger    Exp. Date: 12/17/2010    Lot #: [id number]    VIS given: 01/11/10 version given April 13, 2010.  Flu Vaccine Consent Questions:    Do you have a history of severe allergic reactions to this  vaccine? no    Any prior history of allergic reactions to egg and/or gelatin? no    Do you have a sensitivity to the preservative Thimersol? no    Do you have a past history of Guillan-Barre Syndrome? no    Do you currently have an acute febrile illness? no    Have you ever had a severe reaction to latex? no    Vaccine information given and explained to patient? yes    Are you currently pregnant? no   ndc [phone]  Immunizations Administered:  Influenza Vaccine # 1:    Vaccine Type: Fluvax 3+    Site: left deltoid    Mfr: GlaxoSmithKline    Dose: 0.5 ml    Route: IM    Given by: Levon Hedger    Exp. Date: 12/17/2010    Lot #: [id number]    VIS given: 01/11/10 version given April 13, 2010.    Prevention & Chronic Care Immunizations   Influenza vaccine: Fluvax 3+  (04/13/2010)    Tetanus booster: 06/19/2006: historical per pt    Pneumococcal vaccine: Not documented  Other Screening   Pap smear:  Specimen Adequacy: Satisfactory for evaluation.   Interpretation/Result:Negative for intraepithelial Lesion or Malignancy.     (09/07/2009)   Pap smear due: 09/2010   Smoking status: current  (09/07/2009)   Smoking cessation counseling: yes  (07/06/2008)  Lipids   Total Cholesterol: 211  (12/03/2009)   LDL: 108  (12/03/2009)   LDL Direct: Not documented   HDL: 38  (12/03/2009)   Triglycerides: 326  (12/03/2009)    SGOT  (AST): 16  (12/03/2009)   SGPT (ALT): 13  (12/03/2009)   Alkaline phosphatase: 66  (12/03/2009)   Total bilirubin: 0.2  (12/03/2009)  Hypertension   Last Blood Pressure: 150 / 80  (04/13/2010)   Serum creatinine: 0.75  (07/05/2009)   Serum potassium 4.6  (07/05/2009)  Self-Management Support :    Hypertension self-management support: Not documented    Lipid self-management support: Not documented    Nursing Instructions: Give Flu vaccine today

## 2010-07-19 NOTE — Letter (Signed)
Summary: Lipid Letter  [facility]  [address] [place], [place] [address]   Phone: [phone]  Fax: [phone]    04/14/2010  Nazifa Trinka [address] [place], [place]  [address]  Dear Letitia Neri:  We have carefully reviewed your last lipid profile from 04/13/2010 and the results are noted below with a summary of recommendations for lipid management.    Cholesterol:       266     Goal: less than 200   HDL "good" Cholesterol:   38     Goal: greater than 40   LDL "bad" Cholesterol:   189     Goal: less than 130   Triglycerides:       193     Goal: less than 150    Your cholesterol is still high.  This office has spoken with you about the importance of taking your cholesterol daily to prevent heart attacks and stroke.  You have refills at the pharmacy. Please take nightly as ordered.     Current Medications: 1)    Hydrocodone-acetaminophen 7.5-500 Mg Tabs (Hydrocodone-acetaminophen) .... One tablet by mouth two times a day as needed for pain 2)    Soma 350 Mg  Tabs (Carisoprodol) .Marland Kitchen.. 1 tablet by mouth nightly as needed for muscles 3)    Omeprazole 20 Mg Cpdr (Omeprazole) .Marland Kitchen.. 1 capsule by mouth 30 minutes before breakfast and 30 minutes before dinner 4)    Meloxicam 15 Mg Tabs (Meloxicam) .... One tablet by mouth daily for back 5)    Seroquel 300 Mg Tabs (Quetiapine fumarate) .Marland Kitchen.. 1 tablet by mouth nightly **gx [facility]** 6)    Cymbalta 60 Mg Cpep (Duloxetine hcl) .Marland Kitchen.. 1 tablet by mouth daily **rx by [facility]** 7)    Triamcinolone Acetonide 0.1 % Oint (Triamcinolone acetonide) .Marland Kitchen.. 1 application topically to affected area two times a day 8)    Benazepril Hcl 10 Mg Tabs (Benazepril hcl) .... One tablet by mouth daily for blood pressure 9)    Pravastatin Sodium 40 Mg Tabs (Pravastatin sodium) .... One tablet by mouth nightly for cholesterol  **note change in dose** 10)    Zolpidem Tartrate 10 Mg Tabs (Zolpidem tartrate) .... One  tablet by mouth nightly as needed for rest 11)    Augmentin 875-125 Mg Tabs (Amoxicillin-pot clavulanate) .... One tablet by mouth two times a day for infection 12)    Tylenol With Codeine #3 300-30 Mg Tabs (Acetaminophen-codeine) .... One tablet by mouth two times a day as needed for pain  If you have any questions, please call. We appreciate being able to work with you.   Sincerely,  [facility] Lehman Prom FNP

## 2010-07-19 NOTE — Progress Notes (Signed)
Summary: PT WITH EAR PAIN  Phone Note Call from Patient Call back at [phone]   Summary of Call: PT HAVE EARS PAIN SHE NEEDS APPOINTMENT I OFFER NOV 2 . IS THE FIRST AVALIBLE THAT WE HAVE SHE ASK TO TALK TO THE NURSE TO SEE IF SHE CAN BE SEEN SOONER. Initial call taken by: Domenic Polite,  April 11, 2010 9:17 AM  Follow-up for Phone Call        Left message on answering machine for pt. to return call.  [name] RN  April 11, 2010 4:03 PM  Has bad right ear pain, completely stopped up, c/o tooth pain as well. Pt. wants to be seen.  Appt. made for 04/13/10. Follow-up by: [name] RN,  April 11, 2010 4:15 PM  Additional Follow-up for Phone Call Additional follow up Details #1::        pt into the office today. treated Additional Follow-up by: Lehman Prom FNP,  April 13, 2010 10:13 AM

## 2010-07-19 NOTE — Progress Notes (Signed)
Summary: pain medication refill  Phone Note Call from Patient Call back at [facility] Phone [phone]   Summary of Call: pt needs more medical refills from her vicodin medication.  pt states that she can pick up on Thursday, Feb 24. [facility] FNP  Initial call taken by: [name],  August 10, 2009 12:14 PM  Follow-up for Phone Call        last rx written 1/24...will forward to provider for review. Follow-up by: Mikey College CMA,  August 10, 2009 3:22 PM  Additional Follow-up for Phone Call Additional follow up Details #1::        will have meds available on 08/12/2009  Additional Follow-up by: Lehman Prom FNP,  August 10, 2009 4:53 PM    Additional Follow-up for Phone Call Additional follow up Details #2::    Rx printed and given to to [facility] pt in office to pick up she also needs to reschedule CPE Follow-up by: Lehman Prom FNP,  August 12, 2009 10:24 AM  Prescriptions: HYDROCODONE-ACETAMINOPHEN 7.5-500 MG TABS (HYDROCODONE-ACETAMINOPHEN) One tablet by mouth two times a day as needed for pain  #60 x 0   Entered and Authorized by:   Lehman Prom FNP   Signed by:   Lehman Prom FNP on 08/12/2009   Method used:   Print then Give to Patient   RxID:   [id number]

## 2010-07-19 NOTE — Progress Notes (Signed)
Summary: REQUESTING HER REFILLS  Phone Note Call from Patient Call back at Home Phone [phone]   Reason for Call: Refill Medication Summary of Call: Holly Benjamin CALLING FOR HER REFILL ON  HYDROCODONE AND HER SLEEP MEDICATION. Initial call taken by: Leodis Rains,  December 30, 2009 2:35 PM  Follow-up for Phone Call        forward to N. Daphine Deutscher, FNP Follow-up by: Levon Hedger,  December 30, 2009 2:51 PM  Additional Follow-up for Phone Call Additional follow up Details #1::        Rx due on  01/02/2010 will fax to pharmacy with instructions for med to be depensed on due date notify pt to check there Additional Follow-up by: Lehman Prom FNP,  December 30, 2009 3:52 PM    Additional Follow-up for Phone Call Additional follow up Details #2::    pt informed. Follow-up by: Levon Hedger,  December 30, 2009 4:36 PM  Prescriptions: ZOLPIDEM TARTRATE 10 MG TABS (ZOLPIDEM TARTRATE) One tablet by mouth nightly as needed for rest  #15 x 0   Entered and Authorized by:   Lehman Prom FNP   Signed by:   Lehman Prom FNP on 12/30/2009   Method used:   Printed then faxed to ...       [facility] [address] [phone]* (retail)       [address]       [address]       [place], [place]  [address]       Ph: [phone]       Fax: [phone]   RxID:   [id number] HYDROCODONE-ACETAMINOPHEN 7.5-500 MG TABS (HYDROCODONE-ACETAMINOPHEN) One tablet by mouth two times a day as needed for pain  #60 x 0   Entered and Authorized by:   Lehman Prom FNP   Signed by:   Lehman Prom FNP on 12/30/2009   Method used:   Printed then faxed to ...       [facility] [address] [phone]* (retail)       [address]       [address]       [place], [place]  [address]       Ph: [phone]       Fax: [phone]   RxID:   [id number]   Appended Document: REQUESTING HER REFILLS Prescriptions: BENAZEPRIL HCL 10 MG TABS (BENAZEPRIL HCL) One tablet by mouth daily for blood pressure  #30 x 5  Entered and Authorized by:   Lehman Prom FNP   Signed by:   Lehman Prom FNP on 04/01/2010   Method used:   Electronically to        [facility] [address] [phone]* (retail)       [address]       [address]       [place], [place]  [address]       Ph: [phone]       Fax: [phone]   RxID:   [id number]  Do you want her benazepril refilled as well?  Last date of refill was 06/2008 x5 refills.  [name] RN  April 01, 2010 4:28 PM

## 2010-07-19 NOTE — Assessment & Plan Note (Signed)
Summary: Hypercholesterolemia   Vital Signs:  Patient profile:   35 year old female Weight:      268 pounds Temp:     97.8 degrees F oral Pulse rate:   82 / minute Pulse rhythm:   regular Resp:     20 per minute BP sitting:   129 / 83  (left arm) Cuff size:   large  Vitals Entered By: [name] RN (December 03, 2009 9:10 AM) CC: here for f/u cholesterol check, need refills on soma and pain meds, Lipid Management, Hypertension Management Is Patient Diabetic? Yes Pain Assessment Patient in pain? yes     Location: lower back Intensity: 7 Type: sharp Onset of pain  Constant CBG Result 83 CBG Device ID A  Does patient need assistance? Functional Status Self care Ambulation Normal   CC:  here for f/u cholesterol check, need refills on soma and pain meds, Lipid Management, and Hypertension Management.  History of Present Illness:  Pt into the office to recheck cholesterol   Social - pt is heading to the beach today on vacation  Hypertension History:      She denies headache, chest pain, and palpitations.  She notes no problems with any antihypertensive medication side effects.        Positive major cardiovascular risk factors include hyperlipidemia, hypertension, and current tobacco user.  Negative major cardiovascular risk factors include female age less than 75 years old and no history of diabetes.        Further assessment for target organ damage reveals no history of ASHD, cardiac end-organ damage (CHF/LVH), stroke/TIA, peripheral vascular disease, renal insufficiency, or hypertensive retinopathy.    Lipid Management History:      Positive NCEP/ATP III risk factors include HDL cholesterol less than 40, current tobacco user, and hypertension.  Negative NCEP/ATP III risk factors include female age less than 65 years old, no history of early menopause without estrogen hormone replacement, non-diabetic, no ASHD (atherosclerotic heart disease), no prior stroke/TIA, no  peripheral vascular disease, and no history of aortic aneurysm.        The patient states that she does not know about the "Therapeutic Lifestyle Change" diet.  The patient does not know about adjunctive measures for cholesterol lowering.  Comments include: Pt was started on pravastatin during her last visit.     Allergies: No Known Drug Allergies  Review of Systems General:  Complains of sleep disorder; Pt has been taking more of her soma for sleep.  Pt has only been ordered to take once per day. She lies in the bed and is unable to go to sleep. Then when she finally does drift off to sleep she only sleeps for about 3-4 hours then wakes. CV:  Denies chest pain or discomfort. Resp:  Denies cough. GI:  Denies abdominal pain, nausea, and vomiting. MS:  Complains of joint pain.  Physical Exam  General:  alert.   Head:  normocephalic.   Ears:  ear piercing(s) noted.   Mouth:  mutiple piercings to eyebrow Lungs:  normal breath sounds.   Heart:  normal rate and regular rhythm.   Abdomen:  normal bowel sounds.   Msk:  up to the exam table Neurologic:  cane use limping gait   Impression & Recommendations:  Problem # 1:  HYPERCHOLESTEROLEMIA (ICD-272.0) will recheck today pt has been taking her medications as ordered Her updated medication list for this problem includes:    Pravastatin Sodium 20 Mg Tabs (Pravastatin sodium) ..... One tablet by  mouth nightly for cholesterol  Problem # 2:  HYPERTENSION, BENIGN ESSENTIAL (ICD-401.1) BP is stable reviewed DASH diet Her updated medication list for this problem includes:    Benazepril Hcl 10 Mg Tabs (Benazepril hcl) ..... One tablet by mouth daily for blood pressure  Problem # 3:  SLEEP DISORDER (ICD-780.50) will start sleep aid  advised pt NOT to take at the same time as soma  Problem # 4:  BIPOLAR AFFECTIVE DISORDER (ICD-296.80) Pt to continue to go to the [facility]  Complete Medication List: 1)  Hydrocodone-acetaminophen  7.5-500 Mg Tabs (Hydrocodone-acetaminophen) .... One tablet by mouth two times a day as needed for pain 2)  Soma 350 Mg Tabs (Carisoprodol) .Marland Kitchen.. 1 tablet by mouth nightly as needed for muscles 3)  Omeprazole 20 Mg Cpdr (Omeprazole) .Marland Kitchen.. 1 capsule by mouth 30 minutes before breakfast and 30 minutes before dinner 4)  Meloxicam 15 Mg Tabs (Meloxicam) .... One tablet by mouth daily for back 5)  Seroquel 300 Mg Tabs (Quetiapine fumarate) .Marland Kitchen.. 1 tablet by mouth nightly **gx [facility]** 6)  Cymbalta 60 Mg Cpep (Duloxetine hcl) .Marland Kitchen.. 1 tablet by mouth daily **rx by [facility]** 7)  Triamcinolone Acetonide 0.1 % Oint (Triamcinolone acetonide) .Marland Kitchen.. 1 application topically to affected area two times a day 8)  Benazepril Hcl 10 Mg Tabs (Benazepril hcl) .... One tablet by mouth daily for blood pressure 9)  Pravastatin Sodium 20 Mg Tabs (Pravastatin sodium) .... One tablet by mouth nightly for cholesterol 10)  Zolpidem Tartrate 10 Mg Tabs (Zolpidem tartrate) .... One tablet by mouth nightly as needed for rest  Other Orders: Capillary Blood Glucose/CBG (16109) T-Lipid Profile (60454-09811) T-Hepatic Function [phone])  Hypertension Assessment/Plan:      The patient's hypertensive risk group is category B: At least one risk factor (excluding diabetes) with no target organ damage.  Her calculated 10 year risk of coronary heart disease is 1 %.  Today's blood pressure is 129/83.  Her blood pressure goal is < 140/90.  Lipid Assessment/Plan:      Based on NCEP/ATP III, the patient's risk factor category is "2 or more risk factors and a calculated 10 year CAD risk of < 20%".  The patient's lipid goals are as follows: Total cholesterol goal is 200; LDL cholesterol goal is 130; HDL cholesterol goal is 40; Triglyceride goal is 150.     Patient Instructions: 1)  Blood pressure - doing well. 2)  Keep up the activities such as swimming and walking as this is good for your body 3)  Your cholesterol will  be checked today.  You will be notified of the results. 4)  Sleep  - New prescription started to take as needed for sleep 5)  Blood sugar - doing well 6)  Follow up as needed Prescriptions: ZOLPIDEM TARTRATE 10 MG TABS (ZOLPIDEM TARTRATE) One tablet by mouth nightly as needed for rest  #15 x 0   Entered and Authorized by:   Lehman Prom FNP   Signed by:   Lehman Prom FNP on 12/03/2009   Method used:   Print then Give to Patient   RxID:   [id number] HYDROCODONE-ACETAMINOPHEN 7.5-500 MG TABS (HYDROCODONE-ACETAMINOPHEN) One tablet by mouth two times a day as needed for pain  #60 x 0   Entered and Authorized by:   Lehman Prom FNP   Signed by:   Lehman Prom FNP on 12/03/2009   Method used:   Print then Give to Patient   RxID:   [id number]

## 2010-07-19 NOTE — Progress Notes (Signed)
Summary: REFILL MEDS  Phone Note Refill Request Call back at [phone]   Refills Requested: Medication #1:  HYDROCODONE-ACETAMINOPHEN 7.5-500 MG TABS One tablet by mouth two times a day as needed for pain   Last Refilled: 12/30/2009  Medication #2:  ZOLPIDEM TARTRATE 10 MG TABS One tablet by mouth nightly as needed for rest.   Last Refilled: 12/30/2009 [facility] [address]   Method Requested: Fax to Local Pharmacy Initial call taken by: Armenia Shannon,  February 01, 2010 12:28 PM Reason for Call: Refill Medication Summary of Call: [name]. Holly Benjamin CALLED FOR HER HYDROCODONE AND SLEEPING MEDICATION. Initial call taken by: Leodis Rains,  February 01, 2010 9:19 AM  Follow-up for Phone Call        Rx printed - shiela to fax to pharmacy. refill given last month on soma so pt should have that refill at the pharmacy and she should request from there. Follow-up by: Lehman Prom FNP,  February 01, 2010 12:31 PM  Additional Follow-up for Phone Call Additional follow up Details #1::        Levon Hedger  February 01, 2010 5:38 PM Left message on machine for pt to return call to the office.    Additional Follow-up for Phone Call Additional follow up Details #2::    Pt not requesting soma, she is requesting the zolpidem Follow-up by: Vesta Mixer CMA,  February 02, 2010 12:13 PM  Additional Follow-up for Phone Call Additional follow up Details #3:: Details for Additional Follow-up Action Taken: ok.  Zolpidem printed for [name] to fax back notify pt n.martin,fnp  February 02, 2010  1:55 PM  Pt to pick up med.   Additional Follow-up by: Armenia Shannon,  February 02, 2010 2:49 PM  Prescriptions: ZOLPIDEM TARTRATE 10 MG TABS (ZOLPIDEM TARTRATE) One tablet by mouth nightly as needed for rest  #15 x 0   Entered and Authorized by:   Lehman Prom FNP   Signed by:   Lehman Prom FNP on 02/02/2010   Method used:   Printed then faxed to ...       [facility] [phone]*  (retail)       [address]       [address]       [place], [place]  [address]       Ph: [phone]       Fax: [phone]   RxID:   [id number] HYDROCODONE-ACETAMINOPHEN 7.5-500 MG TABS (HYDROCODONE-ACETAMINOPHEN) One tablet by mouth two times a day as needed for pain  #60 x 0   Entered and Authorized by:   Lehman Prom FNP   Signed by:   Lehman Prom FNP on 02/01/2010   Method used:   Printed then faxed to ...       [facility] [phone]* (retail)       [address]       [address]       [place], [place]  [address]       Ph: [phone]       Fax: [phone]   RxID:   [id number]

## 2010-07-19 NOTE — Letter (Signed)
Summary: POLICE REPORT  POLICE REPORT   Imported By: Arta Bruce 07/12/2009 15:54:15  _____________________________________________________________________  External Attachment:    Type:   Image     Comment:   External Document

## 2010-07-19 NOTE — Letter (Signed)
Summary: *HSN Results Follow up  [facility]  [address] [place], [place] [address]   Phone: [phone]  Fax: [phone]      09/14/2009   [facility] [address] [place], [place]  [address]   Dear  Ms. Foster HARRIS,                            ____S.Drinkard,FNP   ____D. Gore,FNP       ____B. McPherson,MD   ____V. Rankins,MD    ____E. Mulberry,MD    ____N. Daphine Deutscher, FNP  ____D. Reche Dixon, MD    ____K. Philipp Deputy, MD    ____Other     This letter is to inform you that your recent test(s):  _______Pap Smear    _______Lab Test     _______X-ray    _______ is within acceptable limits  _______ requires a medication change  __X_____ requires a follow-up lab visit  _______ requires a follow-up visit with your provider   Comments:  We have tried to reach you at [phone].  Please contact the office so that we may schedule a lab appointment for you.       _________________________________________________________ If you have any questions, please contact our office                     Sincerely,  Levon Hedger [facility]

## 2010-07-19 NOTE — Progress Notes (Signed)
Summary: time for script  Phone Note Call from Patient Call back at Home Phone [phone]   Reason for Call: Refill Medication Complaint: Chest Pain Summary of Call: martin pt. Holly Benjamin is calling in for her vicoden for this friday. She says to let you know that she misplaced the paper to take to [facility] for an x-ray and would like to pick it up along with her  script. Initial call taken by: Leodis Rains,  October 06, 2009 12:32 PM  Follow-up for Phone Call        last filled 09/07/09 forward to N. Daphine Deutscher, FNP Follow-up by: Levon Hedger,  October 06, 2009 2:16 PM  Additional Follow-up for Phone Call Additional follow up Details #1::        She MISSED the appt for her ultrasound - she had an appt - they called here and notified this office of the missed appt. Can reschedule if pt is ready, will not keep making appts for pt and she no-shows She can pick up Rx on 10/07/2009 as this office is closed on Friday Additional Follow-up by: Lehman Prom FNP,  October 06, 2009 2:45 PM    Additional Follow-up for Phone Call Additional follow up Details #2::    Levon Hedger  October 07, 2009 9:23 AM Left message on machine for pt to return call to the office. I read the message to the pt above and she is aware of it.  She will be calling directly to re-sched the ultrasound app. Pt already pick up her prescription.Marland KitchenMarland KitchenManon Hilding  October 07, 2009 2:28 PM  Prescriptions: HYDROCODONE-ACETAMINOPHEN 7.5-500 MG TABS (HYDROCODONE-ACETAMINOPHEN) One tablet by mouth two times a day as needed for pain  #60 x 0   Entered and Authorized by:   Lehman Prom FNP   Signed by:   Lehman Prom FNP on 10/06/2009   Method used:   Print then Give to Patient   RxID:   [id number]

## 2010-07-19 NOTE — Progress Notes (Signed)
Summary: Needs med refills  Phone Note Call from Patient   Reason for Call: Refill Medication Summary of Call: needs refill on pain med. Hydrocdone and sleeping medicine/didn't know the name of it ///[facility]  [phone] Initial call taken by: Arta Bruce,  May 02, 2010 8:42 AM  Follow-up for Phone Call        Rx in cubby basket - [facility] to [facility] (shiela to fax back)  notify pt to check at pharmacy Follow-up by: Lehman Prom FNP,  May 02, 2010 9:51 AM  Additional Follow-up for Phone Call Additional follow up Details #1::        Left message on answering machine for pt. to return call.  [name] RN  May 02, 2010 12:24 PM  Left message on answering machine for pt. to return call.  [name] RN  May 03, 2010 3:47 PM  Pt. picked up Rxs per pharmacy.  [name] RN  May 04, 2010 10:44 AM      Prescriptions: ZOLPIDEM TARTRATE 10 MG TABS (ZOLPIDEM TARTRATE) One tablet by mouth nightly as needed for rest  #15 x 0   Entered and Authorized by:   Lehman Prom FNP   Signed by:   Lehman Prom FNP on 05/02/2010   Method used:   Printed then faxed to ...       [facility] [phone]* (retail)       [address]       [address]       [place], [place]  [address]       Ph: [phone]       Fax: [phone]   RxID:   [id number] HYDROCODONE-ACETAMINOPHEN 7.5-500 MG TABS (HYDROCODONE-ACETAMINOPHEN) One tablet by mouth two times a day as needed for pain  #60 x 0   Entered and Authorized by:   Lehman Prom FNP   Signed by:   Lehman Prom FNP on 05/02/2010   Method used:   Printed then faxed to ...       [facility] [phone]* (retail)       [address]       [address]       [place], [place]  [address]       Ph: [phone]       Fax: [phone]   RxID:   [id number]

## 2010-07-19 NOTE — Letter (Signed)
Summary: [employer]  [employer]   Imported By: Arta Bruce 01/27/2010 14:27:27  _____________________________________________________________________  External Attachment:    Type:   Image     Comment:   External Document

## 2010-07-19 NOTE — Letter (Signed)
Summary: Generic Letter  [facility]  [address] [place], [place] [address]   Phone: [phone]  Fax: [phone]    12/07/2009  [facility] [address] [place], [place]  [address]  Dear Ms. HARRIS,  We have been unable to contact you by telephone.  Please call our office, at your earliest convenience, so that we may speak with you.   Sincerely,   [name] RN

## 2010-07-19 NOTE — Progress Notes (Signed)
Summary: No show for ultrasound  Phone Note From Other Clinic   Summary of Call: [facility] called from Radiology and pt was a no show for her ultrasounds Initial call taken by: Vesta Mixer CMA,  September 15, 2009 11:21 AM  Follow-up for Phone Call        noted will not reschedule until pt contacts this office i have tried to contact her about another issue and have not been able Follow-up by: Lehman Prom FNP,  September 15, 2009 2:01 PM

## 2010-07-19 NOTE — Progress Notes (Signed)
Summary: Office Visit//DEPRESSION [employer]  Office Visit//[facility]ION [employer]   Imported By: Arta Bruce 11/08/2009 15:10:51  _____________________________________________________________________  External Attachment:    Type:   Image     Comment:   External Document

## 2010-07-19 NOTE — Letter (Signed)
Summary: MAILED REQUESTED LABS TO [facility]  MAILED REQUESTED LABS TO [facility]   Imported By: Arta Bruce 11/11/2009 10:57:02  _____________________________________________________________________  External Attachment:    Type:   Image     Comment:   External Document

## 2010-07-19 NOTE — Progress Notes (Signed)
Summary: Med Refills  Phone Note Refill Request Message from:  Patient  Refills Requested: Medication #1:  HYDROCODONE-ACETAMINOPHEN 7.5-500 MG TABS One tablet by mouth two times a day as needed for pain  Medication #2:  SOMA 350 MG  TABS 1 tablet by mouth nightly as needed for muscles  Medication #3:  ZOLPIDEM TARTRATE 10 MG TABS One tablet by mouth nightly as needed for rest. [facility] [address]  Initial call taken by: Oscar La,  March 03, 2010 11:39 AM  Follow-up for Phone Call        Sent to N. Daphine Deutscher.  [name] RN  March 03, 2010 11:42 AM   Additional Follow-up for Phone Call Additional follow up Details #1::        Rx printed to fax to [facility] Additional Follow-up by: Lehman Prom FNP,  March 03, 2010 3:04 PM    Prescriptions: ZOLPIDEM TARTRATE 10 MG TABS (ZOLPIDEM TARTRATE) One tablet by mouth nightly as needed for rest  #15 x 0   Entered and Authorized by:   Lehman Prom FNP   Signed by:   Lehman Prom FNP on 03/03/2010   Method used:   Printed then faxed to ...       [facility] [phone]* (retail)       [address]       [address]       [place], [place]  [address]       Ph: [phone]       Fax: [phone]   RxID:   [id number] SOMA 350 MG  TABS (CARISOPRODOL) 1 tablet by mouth nightly as needed for muscles  #30 x 3   Entered and Authorized by:   Lehman Prom FNP   Signed by:   Lehman Prom FNP on 03/03/2010   Method used:   Printed then faxed to ...       [facility] [phone]* (retail)       [address]       [address]       [place], [place]  [address]       Ph: [phone]       Fax: [phone]   RxID:   [id number] HYDROCODONE-ACETAMINOPHEN 7.5-500 MG TABS (HYDROCODONE-ACETAMINOPHEN) One tablet by mouth two times a day as needed for pain  #60 x 0   Entered and Authorized by:   Lehman Prom FNP   Signed by:   Lehman Prom FNP on 03/03/2010   Method used:   Printed then  faxed to ...       [facility] [phone]* (retail)       [address]       [address]       [place], [place]  [address]       Ph: [phone]       Fax: [phone]   RxID:   [id number] ZOLPIDEM TARTRATE 10 MG TABS (ZOLPIDEM TARTRATE) One tablet by mouth nightly as needed for rest  #15 x 0   Entered and Authorized by:   Lehman Prom FNP   Signed by:   Lehman Prom FNP on 03/03/2010   Method used:   Printed then faxed to ...       [facility] [phone]* (retail)       [address]       [address]       [place], [place]  [address]       Ph: [phone]  Fax: [phone]   RxID:   [id number] SOMA 350 MG  TABS (CARISOPRODOL) 1 tablet by mouth nightly as needed for muscles  #30 x 3   Entered and Authorized by:   Lehman Prom FNP   Signed by:   Lehman Prom FNP on 03/03/2010   Method used:   Printed then faxed to ...       [facility] [phone]* (retail)       [address]       [address]       [place], [place]  [address]       Ph: [phone]       Fax: [phone]   RxID:   [id number] HYDROCODONE-ACETAMINOPHEN 7.5-500 MG TABS (HYDROCODONE-ACETAMINOPHEN) One tablet by mouth two times a day as needed for pain  #60 x 0   Entered and Authorized by:   Lehman Prom FNP   Signed by:   Lehman Prom FNP on 03/03/2010   Method used:   Printed then faxed to ...       [facility] [phone]* (retail)       [address]       [address]       [place], [place]  [address]       Ph: [phone]       Fax: [phone]   RxID:   [id number]

## 2010-07-19 NOTE — Progress Notes (Signed)
Summary: Needs additonal labs  Phone Note Outgoing Call   Summary of Call: Attempted to call pt at number in chart and at [phone], unable to reach her at either her wbc are extremely elevated.  I contacted [facility] and they don't think it is due to her medications suggest she come back for additional labs - Sed rate, anemia panel, hepatitis - chronic, retic, PT/INR, ANA Initial call taken by: Lehman Prom FNP,  September 13, 2009 5:16 PM  Follow-up for Phone Call        called [phone] pt mailbox is not set up. Will mail letter. Levon Hedger  September 14, 2009 11:43 AM   New Problems: LEUKOCYTOSIS (ICD-288.60)   New Problems: LEUKOCYTOSIS (ICD-288.60)

## 2010-07-19 NOTE — Letter (Signed)
Summary: *HSN Results Follow up  [facility]  [address] [place], [place] [address]   Phone: [phone]  Fax: [phone]      09/13/2009   [facility] [address] [place], [place]  [address]   Dear  Ms. Holly Benjamin,                            ____S.Drinkard,FNP   ____D. Gore,FNP       ____B. McPherson,MD   ____V. Rankins,MD    ____E. Mulberry,MD    __X__N. Daphine Deutscher, FNP  ____D. Reche Dixon, MD    ____K. Philipp Deputy, MD    ____Other     This letter is to inform you that your recent test(s):  _______Pap Smear    ___X____Lab Test     _______X-ray    _______ is within acceptable limits  _______ requires a medication change  ___X____ requires a follow-up lab visit  _______ requires a follow-up visit with your provider   Comments:  This office has been trying to call you and have been unable to reach you.  Call this office for more information about your labs.       _________________________________________________________ If you have any questions, please contact our office [phone].                    Sincerely,    Lehman Prom FNP [facility]
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
## 2010-07-19 NOTE — Letter (Signed)
Summary: TEST ORDER FROM//[facility] //aPPT DATE & TIME  TEST ORDER FROM//[facility] //aPPT DATE & TIME   Imported By: Arta Bruce 11/18/2009 12:17:41  _____________________________________________________________________  External Attachment:    Type:   Image     Comment:   External Document

## 2010-07-19 NOTE — Progress Notes (Signed)
Summary: Need additional labs  Phone Note Outgoing Call   Summary of Call: attempt to contact pt she needs to return for the following additional labs - Sed rate, anemia panel, hepatitis - chronic, retic, PT/INR, ANA Initial call taken by: Lehman Prom FNP,  Oct 25, 2009 1:35 PM  Follow-up for Phone Call        left message on machine for pt to return call to the office. Levon Hedger  Oct 27, 2009 12:29 PM

## 2010-07-20 ENCOUNTER — Encounter (INDEPENDENT_AMBULATORY_CARE_PROVIDER_SITE_OTHER): Payer: Self-pay | Admitting: Nurse Practitioner

## 2010-07-21 NOTE — Progress Notes (Signed)
Summary: CALLING IN FOR HER PAIN MEDS  Phone Note Call from Patient Call back at Home Phone [phone]   Reason for Call: Refill Medication Summary of Call: [name]. MS HARRIS CALLED AHEAD FOR A REFILL ON HER OXYCODONE AND SOMAS. Initial call taken by: Leodis Rains,  June 28, 2010 12:34 PM  Follow-up for Phone Call        meds due on 07/02/2010 - will fill on Monday Follow-up by: Lehman Prom FNP,  June 28, 2010 12:45 PM  Additional Follow-up for Phone Call Additional follow up Details #1::        pt is aware Additional Follow-up by: [place] Shannon,  June 29, 2010 10:46 AM    Prescriptions: SOMA 350 MG  TABS (CARISOPRODOL) 1 tablet by mouth nightly as needed for muscles  #30 x 3   Entered and Authorized by:   Lehman Prom FNP   Signed by:   Lehman Prom FNP on 07/04/2010   Method used:   Printed then faxed to ...       [facility] [phone]* (retail)       [address]       [address]       [place], [place]  [address]       Ph: [phone]       Fax: [phone]   RxID:   [id number] HYDROCODONE-ACETAMINOPHEN 7.5-500 MG TABS (HYDROCODONE-ACETAMINOPHEN) One tablet by mouth two times a day as needed for pain  #60 x 0   Entered and Authorized by:   Lehman Prom FNP   Signed by:   Lehman Prom FNP on 07/04/2010   Method used:   Printed then faxed to ...       [facility] [phone]* (retail)       [address]       [address]       [place], [place]  [address]       Ph: [phone]       Fax: [phone]   RxID:   [id number]

## 2010-07-22 ENCOUNTER — Telehealth (INDEPENDENT_AMBULATORY_CARE_PROVIDER_SITE_OTHER): Payer: Self-pay | Admitting: Nurse Practitioner

## 2010-07-27 NOTE — Miscellaneous (Signed)
Summary: Med change - Soma  Phone Note Outgoing Call   Summary of Call: notify pt that her medicare Rx plan will no longer cover soma will change to cyclobenzaprine 10mg  by mouth nightly as needed (rx in basket) verify pts pharmacy and fax there Initial call taken by: Lehman Prom FNP,  July 20, 2010 6:16 PM  Follow-up for Phone Call        Left message on answering machine for pt. to return call.  [name] RN  July 21, 2010 10:12 AM  Spoke with aunt Lora Havens -- please see phone note titled "Confidential information" Follow-up by: [name] RN,  July 22, 2010 1:19 PM    New/Updated Medications: CYCLOBENZAPRINE HCL 10 MG TABS (CYCLOBENZAPRINE HCL) One tablet by mouth nightly as needed for muscles Clinical Lists Changes  Medications: Removed medication of TYLENOL WITH CODEINE #3 300-30 MG TABS (ACETAMINOPHEN-CODEINE) One tablet by mouth two times a day as needed for pain Removed medication of AUGMENTIN 875-125 MG TABS (AMOXICILLIN-POT CLAVULANATE) One tablet by mouth two times a day for infection Removed medication of SOMA 350 MG  TABS (CARISOPRODOL) 1 tablet by mouth nightly as needed for muscles Added new medication of CYCLOBENZAPRINE HCL 10 MG TABS (CYCLOBENZAPRINE HCL) One tablet by mouth nightly as needed for muscles - Signed Rx of CYCLOBENZAPRINE HCL 10 MG TABS (CYCLOBENZAPRINE HCL) One tablet by mouth nightly as needed for muscles;  #30 x 0;  Signed;  Entered by: Lehman Prom FNP;  Authorized by: Lehman Prom FNP;  Method used: Printed then faxed to [facility] [phone]*, [address], [address] [place], [place]  [address], Ph: [phone], Fax: [phone]    Prescriptions: CYCLOBENZAPRINE HCL 10 MG TABS (CYCLOBENZAPRINE HCL) One tablet by mouth nightly as needed for muscles  #30 x 0   Entered and Authorized by:   Lehman Prom FNP   Signed by:   Lehman Prom FNP on 07/20/2010   Method used:   Printed then faxed to ...    [facility] [phone]* (retail)       [address]       [address]       [place], [place]  [address]       Ph: [phone]       Fax: [phone]   RxID:   [id number]
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
## 2010-08-04 NOTE — Medication Information (Signed)
Summary: RX Folder//[employer] PRESCRIPTION  RX Folder//[employer] PRESCRIPTION   Imported By: Arta Bruce 07/28/2010 12:08:29  _____________________________________________________________________  External Attachment:    Type:   Image     Comment:   External Document

## 2010-09-06 LAB — POCT CARDIAC MARKERS
CKMB, poc: 2.7 ng/mL (ref 1.0–8.0)
Myoglobin, poc: 101 ng/mL (ref 12–200)

## 2010-09-06 LAB — BASIC METABOLIC PANEL
BUN: 9 mg/dL (ref 6–23)
Calcium: 9.4 mg/dL (ref 8.4–10.5)
GFR calc non Af Amer: >60 mL/min (ref >60)
Glucose, Bld: 100 mg/dL — ABNORMAL HIGH (ref 70–99)
Sodium: 137 mEq/L (ref 135–145)

## 2010-09-06 LAB — CBC
Platelets: 260 10*3/uL (ref 150–400)
RDW: 14 % (ref 11.5–15.5)

## 2010-09-06 LAB — D-DIMER, QUANTITATIVE: D-Dimer, Quant: 0.22 ug/mL-FEU (ref 0.00–0.48)

## 2010-09-06 LAB — DIFFERENTIAL
Basophils Absolute: 0.1 10*3/uL (ref 0.0–0.1)
Lymphocytes Relative: 19 % (ref 12–46)
Neutro Abs: 12.3 10*3/uL — ABNORMAL HIGH (ref 1.7–7.7)
Neutrophils Relative %: 74 % (ref 43–77)

## 2010-09-27 LAB — URINALYSIS, ROUTINE W REFLEX MICROSCOPIC
Bilirubin Urine: NEGATIVE
Hgb urine dipstick: NEGATIVE
Protein, ur: NEGATIVE mg/dL
Urobilinogen, UA: 1 mg/dL (ref 0.0–1.0)

## 2011-03-10 LAB — PROTIME-INR
INR: 0.9
Prothrombin Time: 12.5

## 2011-03-10 LAB — ACETAMINOPHEN LEVEL: Acetaminophen (Tylenol), Serum: <10 — ABNORMAL LOW

## 2011-03-10 LAB — URINALYSIS, ROUTINE W REFLEX MICROSCOPIC
Bilirubin Urine: NEGATIVE
Glucose, UA: NEGATIVE
Hgb urine dipstick: NEGATIVE
Ketones, ur: NEGATIVE
Leukocytes, UA: NEGATIVE
Nitrite: NEGATIVE
Protein, ur: 30 — AB
Specific Gravity, Urine: 1.038 — ABNORMAL HIGH
Urobilinogen, UA: 0.2
pH: 5

## 2011-03-10 LAB — CBC
HCT: 35.5 — ABNORMAL LOW
Hemoglobin: 12.3
MCHC: 34.6
MCV: 87.5
Platelets: 186
RBC: 4.05
RDW: 13.9
WBC: 9.5

## 2011-03-10 LAB — COMPREHENSIVE METABOLIC PANEL WITH GFR
ALT: 51 — ABNORMAL HIGH
AST: 40 — ABNORMAL HIGH
Albumin: 3.3 — ABNORMAL LOW
Calcium: 8.7
GFR calc Af Amer: >60
Potassium: 3.3 — ABNORMAL LOW
Sodium: 140
Total Protein: 6.1

## 2011-03-10 LAB — COMPREHENSIVE METABOLIC PANEL
Alkaline Phosphatase: 70
BUN: 6
CO2: 26
Chloride: 107
Creatinine, Ser: 0.74
GFR calc non Af Amer: >60
Glucose, Bld: 139 — ABNORMAL HIGH
Total Bilirubin: 0.4

## 2011-03-10 LAB — RAPID URINE DRUG SCREEN, HOSP PERFORMED
Amphetamines: NOT DETECTED
Barbiturates: NOT DETECTED
Benzodiazepines: POSITIVE — AB
Cocaine: POSITIVE — AB
Opiates: POSITIVE — AB
Tetrahydrocannabinol: NOT DETECTED

## 2011-03-10 LAB — URINE MICROSCOPIC-ADD ON

## 2011-03-10 LAB — DIFFERENTIAL
Basophils Absolute: 0
Basophils Relative: 0
Eosinophils Absolute: 0.1
Eosinophils Relative: 1
Lymphocytes Relative: 21
Lymphs Abs: 2
Monocytes Absolute: 0.5
Monocytes Relative: 6
Neutro Abs: 6.8
Neutrophils Relative %: 72

## 2011-03-10 LAB — TROPONIN I: Troponin I: 0.01

## 2011-03-10 LAB — PREGNANCY, URINE: Preg Test, Ur: NEGATIVE

## 2011-03-10 LAB — SALICYLATE LEVEL: Salicylate Lvl: <4

## 2011-03-10 LAB — ETHANOL: Alcohol, Ethyl (B): <5

## 2011-03-31 LAB — URINALYSIS, ROUTINE W REFLEX MICROSCOPIC
Bilirubin Urine: NEGATIVE
Glucose, UA: NEGATIVE
Hgb urine dipstick: NEGATIVE
Ketones, ur: NEGATIVE
Nitrite: NEGATIVE
Protein, ur: NEGATIVE
Specific Gravity, Urine: 1.026
Urobilinogen, UA: 0.2
pH: 5.5

## 2011-03-31 LAB — POCT PREGNANCY, URINE
Operator id: 29008
Preg Test, Ur: NEGATIVE

## 2011-03-31 LAB — CBC
HCT: 40.5
Hemoglobin: 13.8
MCHC: 34.1
MCV: 83.6
Platelets: 320
RBC: 4.84
RDW: 15.1 — ABNORMAL HIGH
WBC: 13.2 — ABNORMAL HIGH

## 2011-03-31 LAB — COMPREHENSIVE METABOLIC PANEL
ALT: 24
AST: 19
Albumin: 3.6
Alkaline Phosphatase: 84
BUN: 6
CO2: 29
Calcium: 9.5
Chloride: 105
Creatinine, Ser: 0.74
GFR calc Af Amer: >60
GFR calc non Af Amer: >60
Glucose, Bld: 107 — ABNORMAL HIGH
Potassium: 4.1
Sodium: 141
Total Bilirubin: 0.5
Total Protein: 7.2

## 2011-03-31 LAB — GC/CHLAMYDIA PROBE AMP, GENITAL
Chlamydia, DNA Probe: NEGATIVE
GC Probe Amp, Genital: NEGATIVE

## 2011-03-31 LAB — WET PREP, GENITAL
Clue Cells Wet Prep HPF POC: NONE SEEN
Trich, Wet Prep: NONE SEEN
Yeast Wet Prep HPF POC: NONE SEEN

## 2011-03-31 LAB — DIFFERENTIAL
Basophils Absolute: 0.1
Basophils Relative: 1
Eosinophils Absolute: 0.1
Eosinophils Relative: 1
Lymphocytes Relative: 23
Lymphs Abs: 3
Monocytes Absolute: 1.1 — ABNORMAL HIGH
Monocytes Relative: 8
Neutro Abs: 8.9 — ABNORMAL HIGH
Neutrophils Relative %: 67

## 2011-03-31 LAB — RPR: RPR Ser Ql: NONREACTIVE

## 2011-03-31 LAB — LIPASE, BLOOD: Lipase: 22

## 2011-04-03 LAB — COMPREHENSIVE METABOLIC PANEL
Alkaline Phosphatase: 86
BUN: 11
CO2: 24
Calcium: 9.1
GFR calc non Af Amer: >60
Glucose, Bld: 93
Potassium: 4.6
Total Protein: 6.8

## 2011-04-03 LAB — DIFFERENTIAL
Basophils Relative: 1
Monocytes Relative: 7
Neutro Abs: 7.3
Neutrophils Relative %: 65

## 2011-04-03 LAB — URINE MICROSCOPIC-ADD ON

## 2011-04-03 LAB — URINALYSIS, ROUTINE W REFLEX MICROSCOPIC
Glucose, UA: NEGATIVE
Ketones, ur: 15 — AB
Leukocytes, UA: NEGATIVE
Nitrite: NEGATIVE
Protein, ur: NEGATIVE
Urobilinogen, UA: 1

## 2011-04-03 LAB — LIPASE, BLOOD: Lipase: 30

## 2011-04-03 LAB — CBC
HCT: 38.7
Hemoglobin: 12.9
MCHC: 33.4
RDW: 15.1 — ABNORMAL HIGH

## 2013-04-01 ENCOUNTER — Emergency Department (HOSPITAL_COMMUNITY)
Admission: EM | Admit: 2013-04-01 | Discharge: 2013-04-01 | Disposition: A | Discharge status: Home / Self Care | Payer: Medicare Other | Attending: Emergency Medicine | Admitting: Emergency Medicine

## 2013-04-01 ENCOUNTER — Encounter (HOSPITAL_COMMUNITY): Payer: Self-pay | Admitting: Emergency Medicine

## 2013-04-01 DIAGNOSIS — R51 Headache: Secondary | ICD-10-CM | POA: Insufficient documentation

## 2013-04-01 DIAGNOSIS — K029 Dental caries, unspecified: Secondary | ICD-10-CM | POA: Insufficient documentation

## 2013-04-01 DIAGNOSIS — F17200 Nicotine dependence, unspecified, uncomplicated: Secondary | ICD-10-CM | POA: Insufficient documentation

## 2013-04-01 MED ORDER — HYDROCODONE-ACETAMINOPHEN 5-325 MG PO TABS: 1.0000 | ORAL_TABLET | Freq: Four times a day (QID) | ORAL | Status: DC | PRN

## 2013-04-01 MED ORDER — IBUPROFEN 800 MG PO TABS: 800.0000 mg | ORAL_TABLET | Freq: Three times a day (TID) | ORAL | Status: DC | PRN

## 2013-04-01 MED ORDER — HYDROCODONE-ACETAMINOPHEN 5-325 MG PO TABS
1.0000 | ORAL_TABLET | Freq: Once | ORAL | Status: AC
  Administered 2013-04-01: 1 via ORAL
  Filled 2013-04-01: qty 1

## 2013-04-01 NOTE — ED Notes (Signed)
"  I have a hole in my tooth". Left lower molar pain with slight swelling.

## 2013-04-01 NOTE — ED Notes (Signed)
Pt c/o left lower dental pain x 3 days.

## 2013-04-01 NOTE — ED Provider Notes (Signed)
CSN: [id number]     Arrival date & time 04/01/13  1328 History  This chart was scribed for non-physician practitioner working with Laray Anger, DO by Ronal Fear, ED scribe. This patient was seen in room TR04C/TR04C and the patient's care was started at 2:32 PM.    Chief Complaint  Patient presents with  . Dental Pain    Patient is a 37 y.o. female presenting with tooth pain. The history is provided by the patient. No language interpreter was used.  Dental Pain Location:  Lower Severity:  Mild Onset quality:  Gradual Duration:  3 days Timing:  Rare Progression:  Worsening Associated symptoms: facial pain     Pt is complaining of gradual onset dental pain in her left lower jaw that is worsened by touching or pressure onset 3x days ago.  History reviewed. No pertinent past medical history. History reviewed. No pertinent past surgical history. History reviewed. No pertinent family history. History  Substance Use Topics  . Smoking status: Current Every Day Smoker  . Smokeless tobacco: Not on file  . Alcohol Use: No   OB History   Grav Para Term Preterm Abortions TAB SAB Ect Mult Living                 Review of Systems  HENT: Positive for dental problem.   All other systems reviewed and are negative.    Allergies  Review of patient's allergies indicates no known allergies.  Home Medications  No current outpatient prescriptions on file. BP 141/86  Pulse 105  Temp(Src) 98.3 F (36.8 C) (Oral)  Resp 18  Ht 5\' 3"  (1.6 m)  Wt 220 lb 4.8 oz (99.927 kg)  BMI 39.03 kg/m2  SpO2 97% Physical Exam  Nursing note and vitals reviewed. Constitutional: She is oriented to person, place, and time. She appears well-developed and well-nourished. No distress.  HENT:  Head: Normocephalic and atraumatic.  Mouth/Throat: Oropharynx is clear and moist.  Patient has damage to the the left lower second molar  Eyes: Pupils are equal, round, and reactive to light.  Neck: Normal  range of motion. Neck supple. No tracheal deviation present.  Cardiovascular: Normal rate, regular rhythm and normal heart sounds.   Pulmonary/Chest: Effort normal and breath sounds normal. No respiratory distress.  Abdominal: Soft. There is no tenderness.  Musculoskeletal: Normal range of motion.  Neurological: She is alert and oriented to person, place, and time.  Skin: Skin is warm and dry.  Psychiatric: She has a normal mood and affect. Her behavior is normal.    ED Course  Procedures (including critical care time)  DIAGNOSTIC STUDIES: Oxygen Saturation is 97% on RA, adequate by my interpretation.    COORDINATION OF CARE: 2:32 PM- Pt advised of plan for treatment including a referral to dentist and pain medication and pt agrees.    I personally performed the services described in this documentation, which was scribed in my presence. The recorded information has been reviewed and is accurate.   Carlyle Dolly, PA-C 04/01/13 1437

## 2013-04-02 NOTE — ED Provider Notes (Signed)
Medical screening examination/treatment/procedure(s) were performed by non-physician practitioner and as supervising physician I was immediately available for consultation/collaboration.   Laray Anger, DO 04/02/13 2154

## 2013-04-26 ENCOUNTER — Encounter (HOSPITAL_COMMUNITY): Payer: Self-pay | Admitting: Emergency Medicine

## 2013-04-26 ENCOUNTER — Emergency Department (HOSPITAL_COMMUNITY)
Admission: EM | Admit: 2013-04-26 | Discharge: 2013-04-26 | Disposition: A | Discharge status: Home / Self Care | Payer: Medicare Other | Attending: Emergency Medicine | Admitting: Emergency Medicine

## 2013-04-26 DIAGNOSIS — F17200 Nicotine dependence, unspecified, uncomplicated: Secondary | ICD-10-CM | POA: Insufficient documentation

## 2013-04-26 DIAGNOSIS — K0889 Other specified disorders of teeth and supporting structures: Secondary | ICD-10-CM

## 2013-04-26 DIAGNOSIS — K089 Disorder of teeth and supporting structures, unspecified: Secondary | ICD-10-CM | POA: Insufficient documentation

## 2013-04-26 DIAGNOSIS — K044 Acute apical periodontitis of pulpal origin: Secondary | ICD-10-CM | POA: Insufficient documentation

## 2013-04-26 DIAGNOSIS — K047 Periapical abscess without sinus: Secondary | ICD-10-CM

## 2013-04-26 MED ORDER — AMOXICILLIN 500 MG PO CAPS: 500.0000 mg | ORAL_CAPSULE | Freq: Three times a day (TID) | ORAL | Status: DC

## 2013-04-26 MED ORDER — HYDROCODONE-ACETAMINOPHEN 5-325 MG PO TABS
2.0000 | ORAL_TABLET | Freq: Once | ORAL | Status: AC
  Administered 2013-04-26: 2 via ORAL
  Filled 2013-04-26: qty 2

## 2013-04-26 MED ORDER — HYDROCODONE-ACETAMINOPHEN 5-325 MG PO TABS: 1.0000 | ORAL_TABLET | ORAL | Status: DC | PRN

## 2013-04-26 NOTE — ED Notes (Signed)
Pt c/o L back toothache x 4 days. States she can feel a hole in the tooth but has no insurance for dental care right now.

## 2013-04-26 NOTE — ED Provider Notes (Signed)
CSN: [id number]     Arrival date & time 04/26/13  1745 History  This chart was scribed for non-physician practitioner, Johnnette Gourd, PA-C,working with Darlys Gales, MD, by Karle Plumber, ED Scribe.  This patient was seen in room TR06C/TR06C and the patient's care was started at 6:13 PM.  Chief Complaint  Patient presents with  . Dental Pain   The history is provided by the patient. No language interpreter was used.   HPI Comments:  Holly Benjamin is a 37 y.o. female who presents to the Emergency Department complaining of severe dental pain onset four days ago secondary to having a hole in her lower left tooth. She reports associated left ear pain. She states her pain is a 10/10. Pt states eating and drinking makes the pain worse. She reports taking Tylenol and Ibuprofen with no relief. She states she cannot get into see a dentist due to issues with Medicaid, but it should be resolved in the next three weeks.   No past medical history on file. No past surgical history on file. No family history on file. History  Substance Use Topics  . Smoking status: Current Every Day Smoker  . Smokeless tobacco: Not on file  . Alcohol Use: No   OB History   Grav Para Term Preterm Abortions TAB SAB Ect Mult Living                 Review of Systems  HENT: Positive for dental problem and ear pain (left ear secondary to lower left toothache).   All other systems reviewed and are negative.    Allergies  Review of patient's allergies indicates no known allergies.  Home Medications   Current Outpatient Rx  Name  Route  Sig  Dispense  Refill  . HYDROcodone-acetaminophen (NORCO/VICODIN) 5-325 MG per tablet   Oral   Take 1 tablet by mouth every 6 (six) hours as needed for pain.   15 tablet   0   . ibuprofen (ADVIL,MOTRIN) 800 MG tablet   Oral   Take 1 tablet (800 mg total) by mouth every 8 (eight) hours as needed for pain.   21 tablet   0    Triage Vitals: BP 146/85  Pulse 90   Temp(Src) 98.6 F (37 C) (Oral)  Resp 16  Ht 5\' 3"  (1.6 m)  Wt 225 lb (102.059 kg)  BMI 39.87 kg/m2  SpO2 98% Physical Exam  Nursing note and vitals reviewed. Constitutional: She is oriented to person, place, and time. She appears well-developed and well-nourished. No distress.  HENT:  Head: Normocephalic and atraumatic.  Right Ear: External ear normal.  Left Ear: External ear normal.  Nose: Nose normal.  Mouth/Throat: Uvula is midline, oropharynx is clear and moist and mucous membranes are normal. No trismus in the jaw. Abnormal dentition. Dental caries present. No uvula swelling.  Left lower second molar has dentin exposed with surrounding erythema and edema. No abscess. Left submandibular adenopathy.   Eyes: Conjunctivae and EOM are normal.  Neck: Normal range of motion. Neck supple.  Cardiovascular: Normal rate, regular rhythm and normal heart sounds.   Pulmonary/Chest: Effort normal and breath sounds normal. No respiratory distress.  Musculoskeletal: Normal range of motion. She exhibits no edema.  Neurological: She is alert and oriented to person, place, and time. No sensory deficit.  Skin: Skin is warm and dry. She is not diaphoretic.  Psychiatric: She has a normal mood and affect. Her behavior is normal.    ED Course  Procedures (including  critical care time) DIAGNOSTIC STUDIES: Oxygen Saturation is 98% on RA, normal by my interpretation.   COORDINATION OF CARE: 6:16 PM- Will treat for obvious dental infection. Will give pt pain medication in ED and send home with a prescription for pain medication. Pt verbalizes understanding and agrees to plan.  Labs Review Labs Reviewed - No data to display Imaging Review No results found.  EKG Interpretation   None       MDM   1. Pain, dental   2. Dental infection     Dental pain associated with dental infection. No evidence of dental abscess. Patient is afebrile, non toxic appearing and swallowing secretions well. I gave  patient referral to dentist and stressed the importance of dental follow up for ultimate management of dental pain. I will also give amoxicillin and pain control. Patient voices understanding and is agreeable to plan.  I personally performed the services described in this documentation, which was scribed in my presence. The recorded information has been reviewed and is accurate.    Trevor Mace, PA-C 04/26/13 [phone]

## 2013-04-27 NOTE — ED Provider Notes (Signed)
Medical screening examination/treatment/procedure(s) were performed by non-physician practitioner and as supervising physician I was immediately available for consultation/collaboration.  EKG Interpretation   None         David Masneri, MD 04/27/13 0157 

## 2013-05-07 ENCOUNTER — Encounter (HOSPITAL_COMMUNITY): Payer: Self-pay | Admitting: Emergency Medicine

## 2013-05-07 ENCOUNTER — Emergency Department (HOSPITAL_COMMUNITY)
Admission: EM | Admit: 2013-05-07 | Discharge: 2013-05-07 | Disposition: A | Discharge status: Home / Self Care | Payer: Medicare Other | Attending: Emergency Medicine | Admitting: Emergency Medicine

## 2013-05-07 DIAGNOSIS — Y92410 Unspecified street and highway as the place of occurrence of the external cause: Secondary | ICD-10-CM | POA: Insufficient documentation

## 2013-05-07 DIAGNOSIS — Z792 Long term (current) use of antibiotics: Secondary | ICD-10-CM | POA: Diagnosis not present

## 2013-05-07 DIAGNOSIS — S4980XA Other specified injuries of shoulder and upper arm, unspecified arm, initial encounter: Secondary | ICD-10-CM | POA: Diagnosis not present

## 2013-05-07 DIAGNOSIS — IMO0002 Reserved for concepts with insufficient information to code with codable children: Secondary | ICD-10-CM | POA: Diagnosis present

## 2013-05-07 DIAGNOSIS — G8929 Other chronic pain: Secondary | ICD-10-CM | POA: Insufficient documentation

## 2013-05-07 DIAGNOSIS — F17200 Nicotine dependence, unspecified, uncomplicated: Secondary | ICD-10-CM | POA: Insufficient documentation

## 2013-05-07 DIAGNOSIS — M545 Low back pain, unspecified: Secondary | ICD-10-CM

## 2013-05-07 DIAGNOSIS — Y9389 Activity, other specified: Secondary | ICD-10-CM | POA: Insufficient documentation

## 2013-05-07 DIAGNOSIS — R110 Nausea: Secondary | ICD-10-CM | POA: Diagnosis not present

## 2013-05-07 DIAGNOSIS — S46909A Unspecified injury of unspecified muscle, fascia and tendon at shoulder and upper arm level, unspecified arm, initial encounter: Secondary | ICD-10-CM | POA: Insufficient documentation

## 2013-05-07 MED ORDER — ONDANSETRON 4 MG PO TBDP
4.0000 mg | ORAL_TABLET | Freq: Once | ORAL | Status: AC
  Administered 2013-05-07: 4 mg via ORAL
  Filled 2013-05-07: qty 1

## 2013-05-07 NOTE — ED Notes (Signed)
Pt alert and oriented, with steady gait at time of discharge. Pt given discharge papers and papers explained. All questions answered and pt walked to discharge.  
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
## 2013-05-08 NOTE — ED Provider Notes (Signed)
Medical screening examination/treatment/procedure(s) were performed by non-physician practitioner and as supervising physician I was immediately available for consultation/collaboration.  EKG Interpretation   None        Mackinley Kiehn, MD 05/08/13 0103 

## 2013-05-23 ENCOUNTER — Emergency Department (HOSPITAL_COMMUNITY)
Admission: EM | Admit: 2013-05-23 | Discharge: 2013-05-23 | Disposition: A | Discharge status: Home / Self Care | Payer: Medicaid Other | Attending: Emergency Medicine | Admitting: Emergency Medicine

## 2013-05-23 ENCOUNTER — Encounter (HOSPITAL_COMMUNITY): Payer: Self-pay | Admitting: Emergency Medicine

## 2013-05-23 DIAGNOSIS — K089 Disorder of teeth and supporting structures, unspecified: Secondary | ICD-10-CM | POA: Insufficient documentation

## 2013-05-23 DIAGNOSIS — Z79899 Other long term (current) drug therapy: Secondary | ICD-10-CM | POA: Diagnosis not present

## 2013-05-23 DIAGNOSIS — F17200 Nicotine dependence, unspecified, uncomplicated: Secondary | ICD-10-CM | POA: Diagnosis not present

## 2013-05-23 DIAGNOSIS — K0889 Other specified disorders of teeth and supporting structures: Secondary | ICD-10-CM

## 2013-05-23 MED ORDER — IBUPROFEN 800 MG PO TABS: 800.0000 mg | ORAL_TABLET | Freq: Three times a day (TID) | ORAL | Status: DC

## 2013-05-23 MED ORDER — HYDROCODONE-ACETAMINOPHEN 5-325 MG PO TABS: 1.0000 | ORAL_TABLET | Freq: Four times a day (QID) | ORAL | Status: DC | PRN

## 2013-05-23 MED ORDER — HYDROCODONE-ACETAMINOPHEN 5-325 MG PO TABS
1.0000 | ORAL_TABLET | Freq: Once | ORAL | Status: AC
  Administered 2013-05-23: 1 via ORAL
  Filled 2013-05-23: qty 1

## 2013-05-23 NOTE — ED Provider Notes (Signed)
CSN: [id number]     Arrival date & time 05/23/13  1600 History   First MD Initiated Contact with Patient 05/23/13 1618      This chart was scribed for non-physician practitioner,Marrian Bells PA-C working with Layla Maw Ward, DO by Arlan Organ, ED Scribe. This patient was seen in room TR05C/TR05C and the patient's care was started at 4:53 PM.   Chief Complaint  Patient presents with  . Dental Pain   The history is provided by the patient. No language interpreter was used.    HPI Comments: Holly Benjamin is a 37 y.o. female who presents to the Emergency Department complaining of gradually worsening left lower dental pain that started last night while attempting to eat. Pt states she had a tooth pulled on 05/20/13, and is experiencing swelling and worsening pain to that area. She denies any radiating pain. Pt states nothing makes the pain better, and smoking worsens the pain. Pt states she has not been able to eat and drink secondary to the discomfort. She says she has tried Tylenol with no relief. She says she was unable to schedule a follow up with her dentist due to availability, and was advised to come to the ED. She states she was prescribed antibiotics prior to having her tooth pulled, and was given Xanax after the procedure. Pt denies fever.  History reviewed. No pertinent past medical history. History reviewed. No pertinent past surgical history. History reviewed. No pertinent family history. History  Substance Use Topics  . Smoking status: Current Every Day Smoker -- 1.00 packs/day    Types: Cigarettes  . Smokeless tobacco: Not on file  . Alcohol Use: No   OB History   Grav Para Term Preterm Abortions TAB SAB Ect Mult Living                 Review of Systems  Constitutional: Negative for fever.  HENT: Positive for dental problem. Negative for sore throat, trouble swallowing and voice change.   Gastrointestinal: Negative for nausea and vomiting.    Allergies  Review  of patient's allergies indicates no known allergies.  Home Medications   Current Outpatient Rx  Name  Route  Sig  Dispense  Refill  . amoxicillin (AMOXIL) 500 MG capsule   Oral   Take 1 capsule (500 mg total) by mouth 3 (three) times daily.   21 capsule   0   . HYDROcodone-acetaminophen (NORCO/VICODIN) 5-325 MG per tablet   Oral   Take 1 tablet by mouth every 6 (six) hours as needed for severe pain.   6 tablet   0   . ibuprofen (ADVIL,MOTRIN) 200 MG tablet   Oral   Take 400 mg by mouth every 6 (six) hours as needed for headache.         . ibuprofen (ADVIL,MOTRIN) 800 MG tablet   Oral   Take 1 tablet (800 mg total) by mouth 3 (three) times daily.   21 tablet   0    Triage Vitals: BP 141/87  Temp(Src) 97.9 F (36.6 C) (Oral)  Ht 5\' 3"  (1.6 m)  Wt 237 lb 3.2 oz (107.593 kg)  BMI 42.03 kg/m2  SpO2 100%  LMP 04/21/2013  Physical Exam  Constitutional: She is oriented to person, place, and time. She appears well-developed and well-nourished. No distress.  HENT:  Head: Normocephalic and atraumatic.  Right Ear: External ear normal.  Left Ear: External ear normal.  Nose: Nose normal.  Mouth/Throat: Uvula is midline, oropharynx is clear and  moist and mucous membranes are normal. She does not have dentures. No trismus in the jaw. Abnormal dentition. Dental caries present. No dental abscesses or uvula swelling.    Well healing site marked on graphical documentation. TTP. No erythema or drainage noted.   Eyes: Conjunctivae are normal.  Neck: Normal range of motion. Neck supple.  Pulmonary/Chest: Effort normal.  Neurological: She is alert and oriented to person, place, and time.  Skin: Skin is warm and dry. She is not diaphoretic.  Psychiatric: She has a normal mood and affect.    ED Course  Procedures (including critical care time)  DIAGNOSTIC STUDIES: Oxygen Saturation is 100% on RA, Normal by my interpretation.    COORDINATION OF CARE: 4:53 PM-Will give anti  inflammatory medication. Discussed treatment plan with pt at bedside and pt agreed to plan.     Labs Review Labs Reviewed - No data to display Imaging Review No results found.  EKG Interpretation   None       MDM   1. Pain, dental     Afebrile, NAD, non-toxic appearing, AAOx4. Patient with toothache.  No gross abscess. Well healing site. Exam unconcerning for Ludwig's angina or spread of infection.  Will treat with limited pain medicine.  Urged patient to follow-up with dentist. Return precautions discussed. Patient is agreeable to plan. Patient is stable at time of discharge      I personally performed the services described in this documentation, which was scribed in my presence. The recorded information has been reviewed and is accurate.     Jeannetta Ellis, PA-C 05/23/13 2159

## 2013-05-23 NOTE — Discharge Instructions (Signed)
Please follow up with Dr. Dyanne Iha to schedule a follow up appointment. Please take pain medication as prescribed and as needed for pain. Please do not drive on narcotic pain medication. Please only take Vicodin for severe pain. Please use Motrin as prescribed. Please read all discharge instructions and return precautions.    Dental Pain A tooth ache may be caused by cavities (tooth decay). Cavities expose the nerve of the tooth to air and hot or cold temperatures. It may come from an infection or abscess (also called a boil or furuncle) around your tooth. It is also often caused by dental caries (tooth decay). This causes the pain you are having. DIAGNOSIS  Your caregiver can diagnose this problem by exam. TREATMENT   If caused by an infection, it may be treated with medications which kill germs (antibiotics) and pain medications as prescribed by your caregiver. Take medications as directed.  Only take over-the-counter or prescription medicines for pain, discomfort, or fever as directed by your caregiver.  Whether the tooth ache today is caused by infection or dental disease, you should see your dentist as soon as possible for further care. SEEK MEDICAL CARE IF: The exam and treatment you received today has been provided on an emergency basis only. This is not a substitute for complete medical or dental care. If your problem worsens or new problems (symptoms) appear, and you are unable to meet with your dentist, call or return to this location. SEEK IMMEDIATE MEDICAL CARE IF:   You have a fever.  You develop redness and swelling of your face, jaw, or neck.  You are unable to open your mouth.  You have severe pain uncontrolled by pain medicine. MAKE SURE YOU:   Understand these instructions.  Will watch your condition.  Will get help right away if you are not doing well or get worse. Document Released: 06/05/2005 Document Revised: 08/28/2011 Document Reviewed: 01/22/2008 [facility]  Patient Information 2014 [place], [place].

## 2013-05-23 NOTE — ED Notes (Signed)
Left lower tooth pulled 5 days ago.  St's something came out of socket where tooth was pulled last night.  St's has had pain since then.

## 2013-05-23 NOTE — ED Notes (Signed)
Pt had tooth pulled on Tuesday on left bottom row. Pt reports swelling, increased pain, and "something came out of the hole." Pt states that she thinks a bone came out of the tooth socket. Pt was unable to be seen at her Dentist so was advised to come to the ED.

## 2013-05-23 NOTE — ED Provider Notes (Signed)
Medical screening examination/treatment/procedure(s) were performed by non-physician practitioner and as supervising physician I was immediately available for consultation/collaboration.  EKG Interpretation   None         Kristen N Ward, DO 05/23/13 2357 

## 2014-08-31 ENCOUNTER — Emergency Department (HOSPITAL_COMMUNITY)
Admission: EM | Admit: 2014-08-31 | Discharge: 2014-08-31 | Disposition: A | Discharge status: Home / Self Care | Payer: Medicare Other | Attending: Emergency Medicine | Admitting: Emergency Medicine

## 2014-08-31 ENCOUNTER — Encounter (HOSPITAL_COMMUNITY): Payer: Self-pay | Admitting: Emergency Medicine

## 2014-08-31 DIAGNOSIS — Z720 Tobacco use: Secondary | ICD-10-CM | POA: Insufficient documentation

## 2014-08-31 DIAGNOSIS — K088 Other specified disorders of teeth and supporting structures: Secondary | ICD-10-CM | POA: Diagnosis present

## 2014-08-31 DIAGNOSIS — K0889 Other specified disorders of teeth and supporting structures: Secondary | ICD-10-CM

## 2014-08-31 NOTE — Discharge Instructions (Signed)
Dental Pain A tooth ache may be caused by cavities (tooth decay). Cavities expose the nerve of the tooth to air and hot or cold temperatures. It may come from an infection or abscess (also called a boil or furuncle) around your tooth. It is also often caused by dental caries (tooth decay). This causes the pain you are having. DIAGNOSIS  Your caregiver can diagnose this problem by exam. TREATMENT   If caused by an infection, it may be treated with medications which kill germs (antibiotics) and pain medications as prescribed by your caregiver. Take medications as directed.  Only take over-the-counter or prescription medicines for pain, discomfort, or fever as directed by your caregiver.  Whether the tooth ache today is caused by infection or dental disease, you should see your dentist as soon as possible for further care. SEEK MEDICAL CARE IF: The exam and treatment you received today has been provided on an emergency basis only. This is not a substitute for complete medical or dental care. If your problem worsens or new problems (symptoms) appear, and you are unable to meet with your dentist, call or return to this location. SEEK IMMEDIATE MEDICAL CARE IF:   You have a fever.  You develop redness and swelling of your face, jaw, or neck.  You are unable to open your mouth.  You have severe pain uncontrolled by pain medicine. MAKE SURE YOU:   Understand these instructions.  Will watch your condition.  Will get help right away if you are not doing well or get worse. Document Released: 06/05/2005 Document Revised: 08/28/2011 Document Reviewed: 01/22/2008 [facility] Patient Information 2015 [place]. This information is not intended to replace advice given to you by your health care provider. Make sure you discuss any questions you have with your health care provider.  Please follow-up with your dentist as scheduled. If signs of infection present including fever, redness, or swelling  present follow-up immediately.

## 2014-08-31 NOTE — ED Notes (Signed)
Pt reoprts L lower dental pain but unable to get to dentist until next week. sts pain goes into L ear and she has noticed drainage from L ear. Denies fevers.
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
## 2015-11-08 ENCOUNTER — Encounter (HOSPITAL_COMMUNITY): Payer: Self-pay

## 2015-11-08 ENCOUNTER — Emergency Department (HOSPITAL_COMMUNITY): Payer: Medicare Other

## 2015-11-08 ENCOUNTER — Emergency Department (HOSPITAL_COMMUNITY)
Admission: EM | Admit: 2015-11-08 | Discharge: 2015-11-09 | Discharge status: Against Medical Advice | Payer: Medicare Other | Attending: Emergency Medicine | Admitting: Emergency Medicine

## 2015-11-08 DIAGNOSIS — Y9389 Activity, other specified: Secondary | ICD-10-CM | POA: Diagnosis not present

## 2015-11-08 DIAGNOSIS — Y9289 Other specified places as the place of occurrence of the external cause: Secondary | ICD-10-CM | POA: Diagnosis not present

## 2015-11-08 DIAGNOSIS — F17210 Nicotine dependence, cigarettes, uncomplicated: Secondary | ICD-10-CM | POA: Insufficient documentation

## 2015-11-08 DIAGNOSIS — S060X0A Concussion without loss of consciousness, initial encounter: Secondary | ICD-10-CM

## 2015-11-08 DIAGNOSIS — S01112A Laceration without foreign body of left eyelid and periocular area, initial encounter: Secondary | ICD-10-CM | POA: Insufficient documentation

## 2015-11-08 DIAGNOSIS — S199XXA Unspecified injury of neck, initial encounter: Secondary | ICD-10-CM | POA: Diagnosis not present

## 2015-11-08 DIAGNOSIS — S0990XA Unspecified injury of head, initial encounter: Secondary | ICD-10-CM | POA: Diagnosis present

## 2015-11-08 DIAGNOSIS — R000 Tachycardia, unspecified: Secondary | ICD-10-CM | POA: Diagnosis not present

## 2015-11-08 DIAGNOSIS — S6991XA Unspecified injury of right wrist, hand and finger(s), initial encounter: Secondary | ICD-10-CM | POA: Diagnosis not present

## 2015-11-08 DIAGNOSIS — S3992XA Unspecified injury of lower back, initial encounter: Secondary | ICD-10-CM | POA: Insufficient documentation

## 2015-11-08 DIAGNOSIS — Y998 Other external cause status: Secondary | ICD-10-CM | POA: Insufficient documentation

## 2015-11-08 DIAGNOSIS — Z3202 Encounter for pregnancy test, result negative: Secondary | ICD-10-CM | POA: Insufficient documentation

## 2015-11-08 DIAGNOSIS — S29002A Unspecified injury of muscle and tendon of back wall of thorax, initial encounter: Secondary | ICD-10-CM | POA: Insufficient documentation

## 2015-11-08 DIAGNOSIS — T148XXA Other injury of unspecified body region, initial encounter: Secondary | ICD-10-CM

## 2015-11-08 MED ORDER — SODIUM CHLORIDE 0.9 % IV BOLUS (SEPSIS)
1000.0000 mL | Freq: Once | INTRAVENOUS | Status: AC
  Administered 2015-11-08: 1000 mL via INTRAVENOUS

## 2015-11-08 MED ORDER — FENTANYL CITRATE (PF) 100 MCG/2ML IJ SOLN
50.0000 ug | Freq: Once | INTRAMUSCULAR | Status: AC
  Administered 2015-11-08: 50 ug via INTRAVENOUS
  Filled 2015-11-08: qty 2
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
## 2015-11-08 NOTE — ED Notes (Signed)
Patient transported to X-ray 
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
## 2015-11-09 DIAGNOSIS — S060X0A Concussion without loss of consciousness, initial encounter: Secondary | ICD-10-CM | POA: Diagnosis not present

## 2015-11-09 LAB — BASIC METABOLIC PANEL
ANION GAP: 5 (ref 5–15)
BUN: 5 mg/dL — AB (ref 6–20)
CALCIUM: 8.9 mg/dL (ref 8.9–10.3)
CO2: 28 mmol/L (ref 22–32)
CREATININE: 0.92 mg/dL (ref 0.44–1.00)
Chloride: 106 mmol/L (ref 101–111)
GFR calc Af Amer: >60 mL/min (ref >60)
GLUCOSE: 98 mg/dL (ref 65–99)
Potassium: 3.6 mmol/L (ref 3.5–5.1)
Sodium: 139 mmol/L (ref 135–145)

## 2015-11-09 LAB — CBC WITH DIFFERENTIAL/PLATELET
BASOS ABS: 0 10*3/uL (ref 0.0–0.1)
Basophils Relative: 0 %
EOS PCT: 0 %
Eosinophils Absolute: 0.1 10*3/uL (ref 0.0–0.7)
HCT: 39.5 % (ref 36.0–46.0)
Hemoglobin: 12.9 g/dL (ref 12.0–15.0)
Lymphocytes Relative: 19 %
Lymphs Abs: 2.8 10*3/uL (ref 0.7–4.0)
MCH: 29.2 pg (ref 26.0–34.0)
MCHC: 32.7 g/dL (ref 30.0–36.0)
MCV: 89.4 fL (ref 78.0–100.0)
MONO ABS: 1.4 10*3/uL — AB (ref 0.1–1.0)
Monocytes Relative: 10 %
NEUTROS PCT: 71 %
Neutro Abs: 10.3 10*3/uL — ABNORMAL HIGH (ref 1.7–7.7)
PLATELETS: 205 10*3/uL (ref 150–400)
RBC: 4.42 MIL/uL (ref 3.87–5.11)
RDW: 13.8 % (ref 11.5–15.5)
WBC: 14.6 10*3/uL — AB (ref 4.0–10.5)

## 2015-11-09 LAB — I-STAT BETA HCG BLOOD, ED (MC, WL, AP ONLY)

## 2015-11-09 LAB — ETHANOL: Alcohol, Ethyl (B): <5 mg/dL (ref <5)

## 2015-11-09 MED ORDER — HYDROMORPHONE HCL 1 MG/ML IJ SOLN
1.0000 mg | Freq: Once | INTRAMUSCULAR | Status: DC
  Filled 2015-11-09: qty 1

## 2015-11-09 NOTE — ED Notes (Signed)
RN found visitor pushing the pt in a wheelchair towards the waiting room.  RN inquired as to their destination, they stated they were going to the bathroom.  RN
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
## 2016-04-24 ENCOUNTER — Encounter (HOSPITAL_COMMUNITY): Payer: Self-pay | Admitting: Emergency Medicine

## 2016-04-24 DIAGNOSIS — A5909 Other urogenital trichomoniasis: Secondary | ICD-10-CM | POA: Diagnosis not present

## 2016-04-24 DIAGNOSIS — N898 Other specified noninflammatory disorders of vagina: Secondary | ICD-10-CM | POA: Diagnosis present

## 2016-04-24 DIAGNOSIS — N739 Female pelvic inflammatory disease, unspecified: Secondary | ICD-10-CM | POA: Insufficient documentation

## 2016-04-24 DIAGNOSIS — R300 Dysuria: Secondary | ICD-10-CM | POA: Insufficient documentation

## 2016-04-24 DIAGNOSIS — F17210 Nicotine dependence, cigarettes, uncomplicated: Secondary | ICD-10-CM | POA: Diagnosis not present

## 2016-04-24 DIAGNOSIS — I10 Essential (primary) hypertension: Secondary | ICD-10-CM | POA: Insufficient documentation

## 2016-04-24 LAB — URINE MICROSCOPIC-ADD ON

## 2016-04-24 LAB — BASIC METABOLIC PANEL
Anion gap: 8 (ref 5–15)
BUN: 7 mg/dL (ref 6–20)
CO2: 26 mmol/L (ref 22–32)
CREATININE: 0.86 mg/dL (ref 0.44–1.00)
Calcium: 8.7 mg/dL — ABNORMAL LOW (ref 8.9–10.3)
Chloride: 106 mmol/L (ref 101–111)
Glucose, Bld: 95 mg/dL (ref 65–99)
POTASSIUM: 3.6 mmol/L (ref 3.5–5.1)
SODIUM: 140 mmol/L (ref 135–145)

## 2016-04-24 LAB — CBC
HCT: 38.6 % (ref 36.0–46.0)
Hemoglobin: 12.7 g/dL (ref 12.0–15.0)
MCH: 29.7 pg (ref 26.0–34.0)
MCHC: 32.9 g/dL (ref 30.0–36.0)
MCV: 90.2 fL (ref 78.0–100.0)
PLATELETS: 236 10*3/uL (ref 150–400)
RBC: 4.28 MIL/uL (ref 3.87–5.11)
RDW: 13.5 % (ref 11.5–15.5)
WBC: 7.6 10*3/uL (ref 4.0–10.5)

## 2016-04-24 LAB — URINALYSIS, ROUTINE W REFLEX MICROSCOPIC
Bilirubin Urine: NEGATIVE
Glucose, UA: NEGATIVE mg/dL
Hgb urine dipstick: NEGATIVE
KETONES UR: NEGATIVE mg/dL
NITRITE: NEGATIVE
PH: 6.5 (ref 5.0–8.0)
PROTEIN: NEGATIVE mg/dL
Specific Gravity, Urine: 1.015 (ref 1.005–1.030)

## 2016-04-24 LAB — I-STAT BETA HCG BLOOD, ED (MC, WL, AP ONLY): I-stat hCG, quantitative: <5 m[IU]/mL (ref <5)
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
## 2016-04-25 ENCOUNTER — Emergency Department (HOSPITAL_COMMUNITY): Payer: Medicare Other

## 2016-04-25 ENCOUNTER — Emergency Department (HOSPITAL_COMMUNITY)
Admission: EM | Admit: 2016-04-25 | Discharge: 2016-04-25 | Disposition: A | Discharge status: Home / Self Care | Payer: Medicare Other | Attending: Emergency Medicine | Admitting: Emergency Medicine

## 2016-04-25 DIAGNOSIS — R102 Pelvic and perineal pain: Secondary | ICD-10-CM

## 2016-04-25 DIAGNOSIS — R300 Dysuria: Secondary | ICD-10-CM

## 2016-04-25 DIAGNOSIS — A5909 Other urogenital trichomoniasis: Secondary | ICD-10-CM

## 2016-04-25 DIAGNOSIS — N739 Female pelvic inflammatory disease, unspecified: Secondary | ICD-10-CM | POA: Diagnosis not present

## 2016-04-25 DIAGNOSIS — N730 Acute parametritis and pelvic cellulitis: Secondary | ICD-10-CM

## 2016-04-25 LAB — HIV ANTIBODY (ROUTINE TESTING W REFLEX): HIV Screen 4th Generation wRfx: NONREACTIVE

## 2016-04-25 LAB — WET PREP, GENITAL
SPERM: NONE SEEN
TRICH WET PREP: NONE SEEN
Yeast Wet Prep HPF POC: NONE SEEN

## 2016-04-25 LAB — GC/CHLAMYDIA PROBE AMP (~~LOC~~) NOT AT ARMC
Chlamydia: NEGATIVE
Neisseria Gonorrhea: NEGATIVE

## 2016-04-25 LAB — RPR: RPR Ser Ql: NONREACTIVE

## 2016-04-25 MED ORDER — CEFTRIAXONE SODIUM 250 MG IJ SOLR
250.0000 mg | Freq: Once | INTRAMUSCULAR | Status: AC
  Administered 2016-04-25: 250 mg via INTRAMUSCULAR
  Filled 2016-04-25: qty 250

## 2016-04-25 MED ORDER — ONDANSETRON 4 MG PO TBDP
4.0000 mg | ORAL_TABLET | Freq: Once | ORAL | Status: AC
  Administered 2016-04-25: 4 mg via ORAL
  Filled 2016-04-25: qty 1

## 2016-04-25 MED ORDER — LIDOCAINE HCL (PF) 1 % IJ SOLN
INTRAMUSCULAR | Status: AC
  Administered 2016-04-25: 5 mL
  Filled 2016-04-25: qty 5

## 2016-04-25 MED ORDER — IBUPROFEN 800 MG PO TABS: 800.0000 mg | ORAL_TABLET | Freq: Three times a day (TID) | ORAL | 0 refills | Status: DC

## 2016-04-25 MED ORDER — DOXYCYCLINE HYCLATE 100 MG PO TABS
100.0000 mg | ORAL_TABLET | Freq: Once | ORAL | Status: AC
  Administered 2016-04-25: 100 mg via ORAL
  Filled 2016-04-25: qty 1

## 2016-04-25 MED ORDER — OXYCODONE-ACETAMINOPHEN 5-325 MG PO TABS
1.0000 | ORAL_TABLET | Freq: Once | ORAL | Status: AC
  Administered 2016-04-25: 1 via ORAL
  Filled 2016-04-25: qty 1

## 2016-04-25 MED ORDER — HYDROCODONE-ACETAMINOPHEN 5-325 MG PO TABS: 1.0000 | ORAL_TABLET | Freq: Four times a day (QID) | ORAL | 0 refills | Status: DC | PRN

## 2016-04-25 MED ORDER — METRONIDAZOLE 500 MG PO TABS
500.0000 mg | ORAL_TABLET | Freq: Once | ORAL | Status: AC
  Administered 2016-04-25: 500 mg via ORAL
  Filled 2016-04-25: qty 1

## 2016-04-25 MED ORDER — ONDANSETRON HCL 4 MG PO TABS: 4.0000 mg | ORAL_TABLET | Freq: Three times a day (TID) | ORAL | 0 refills | Status: DC | PRN

## 2016-04-25 MED ORDER — METRONIDAZOLE 500 MG PO TABS: 500.0000 mg | ORAL_TABLET | Freq: Two times a day (BID) | ORAL | 0 refills | Status: DC

## 2016-04-25 MED ORDER — DOXYCYCLINE HYCLATE 100 MG PO CAPS: 100.0000 mg | ORAL_CAPSULE | Freq: Two times a day (BID) | ORAL | 0 refills | Status: DC

## 2016-04-25 MED ORDER — PHENAZOPYRIDINE HCL 200 MG PO TABS: 200.0000 mg | ORAL_TABLET | Freq: Three times a day (TID) | ORAL | 0 refills | Status: DC
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
## 2016-04-25 NOTE — ED Notes (Signed)
Explained to pt about the wait procedure pt very upset and pt LWBS nurse first aware

## 2016-04-25 NOTE — ED Notes (Signed)
Patient transported to Ultrasound 

## 2016-04-25 NOTE — ED Notes (Signed)
ED Provider at bedside. 

## 2016-04-26 LAB — URINE CULTURE

## 2016-09-30 ENCOUNTER — Emergency Department (HOSPITAL_COMMUNITY)
Admission: EM | Admit: 2016-09-30 | Discharge: 2016-09-30 | Disposition: A | Discharge status: Home / Self Care | Payer: Medicare Other | Attending: Emergency Medicine | Admitting: Emergency Medicine

## 2016-09-30 DIAGNOSIS — I10 Essential (primary) hypertension: Secondary | ICD-10-CM | POA: Diagnosis not present

## 2016-09-30 DIAGNOSIS — M5442 Lumbago with sciatica, left side: Secondary | ICD-10-CM | POA: Diagnosis not present

## 2016-09-30 DIAGNOSIS — M545 Low back pain: Secondary | ICD-10-CM | POA: Diagnosis present

## 2016-09-30 DIAGNOSIS — F17210 Nicotine dependence, cigarettes, uncomplicated: Secondary | ICD-10-CM | POA: Diagnosis not present

## 2016-09-30 DIAGNOSIS — M5432 Sciatica, left side: Secondary | ICD-10-CM

## 2016-09-30 LAB — URINALYSIS, COMPLETE (UACMP) WITH MICROSCOPIC
BACTERIA UA: NONE SEEN
Bilirubin Urine: NEGATIVE
Glucose, UA: NEGATIVE mg/dL
Ketones, ur: NEGATIVE mg/dL
Leukocytes, UA: NEGATIVE
NITRITE: NEGATIVE
Protein, ur: NEGATIVE mg/dL
SPECIFIC GRAVITY, URINE: 1.021 (ref 1.005–1.030)
pH: 5 (ref 5.0–8.0)

## 2016-09-30 LAB — POC URINE PREG, ED: Preg Test, Ur: NEGATIVE

## 2016-09-30 MED ORDER — MELOXICAM 15 MG PO TABS: 15.0000 mg | ORAL_TABLET | Freq: Every day | ORAL | 0 refills | Status: DC

## 2016-09-30 MED ORDER — METHOCARBAMOL 500 MG PO TABS: 500.0000 mg | ORAL_TABLET | Freq: Two times a day (BID) | ORAL | 0 refills | Status: DC

## 2016-09-30 NOTE — ED Triage Notes (Signed)
Pt brought to ED by EMS [place] [employer] [facility], for c/o left side flank pain radiating to left leg, pt has some ETOH on board, states she is been having this pain for the past 2 weeks taking Tylenol with no relief, Hx of heroin abuse. BP 148/90, HR 80, SPO2 98%.

## 2016-09-30 NOTE — Discharge Instructions (Signed)
Take mobic as prescribed for pain and inflammation. Take robaxin for muscle spasms as prescribed as needed. Start back exercises as attached. Follow up with family doctor.

## 2016-09-30 NOTE — ED Notes (Signed)
Notified pt need of urine specimen
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
## 2016-12-07 ENCOUNTER — Emergency Department (HOSPITAL_COMMUNITY)
Admission: EM | Admit: 2016-12-07 | Discharge: 2016-12-07 | Disposition: A | Discharge status: Home / Self Care | Payer: Medicare Other | Attending: Emergency Medicine | Admitting: Emergency Medicine

## 2016-12-07 ENCOUNTER — Encounter (HOSPITAL_COMMUNITY): Payer: Self-pay | Admitting: Emergency Medicine

## 2016-12-07 DIAGNOSIS — E7800 Pure hypercholesterolemia, unspecified: Secondary | ICD-10-CM | POA: Insufficient documentation

## 2016-12-07 DIAGNOSIS — R1110 Vomiting, unspecified: Secondary | ICD-10-CM | POA: Diagnosis not present

## 2016-12-07 DIAGNOSIS — Z79899 Other long term (current) drug therapy: Secondary | ICD-10-CM | POA: Diagnosis not present

## 2016-12-07 DIAGNOSIS — R4182 Altered mental status, unspecified: Secondary | ICD-10-CM | POA: Diagnosis present

## 2016-12-07 DIAGNOSIS — F1110 Opioid abuse, uncomplicated: Secondary | ICD-10-CM | POA: Diagnosis not present

## 2016-12-07 DIAGNOSIS — T401X1A Poisoning by heroin, accidental (unintentional), initial encounter: Secondary | ICD-10-CM

## 2016-12-07 DIAGNOSIS — I10 Essential (primary) hypertension: Secondary | ICD-10-CM | POA: Insufficient documentation

## 2016-12-07 LAB — COMPREHENSIVE METABOLIC PANEL
ALT: 45 U/L (ref 14–54)
ANION GAP: 7 (ref 5–15)
AST: 45 U/L — AB (ref 15–41)
Albumin: 3.5 g/dL (ref 3.5–5.0)
Alkaline Phosphatase: 69 U/L (ref 38–126)
BUN: 9 mg/dL (ref 6–20)
CHLORIDE: 110 mmol/L (ref 101–111)
CO2: 21 mmol/L — ABNORMAL LOW (ref 22–32)
Calcium: 8.1 mg/dL — ABNORMAL LOW (ref 8.9–10.3)
Creatinine, Ser: 0.78 mg/dL (ref 0.44–1.00)
GFR calc Af Amer: >60 mL/min (ref >60)
Glucose, Bld: 102 mg/dL — ABNORMAL HIGH (ref 65–99)
POTASSIUM: 3.7 mmol/L (ref 3.5–5.1)
Sodium: 138 mmol/L (ref 135–145)
Total Bilirubin: 0.3 mg/dL (ref 0.3–1.2)
Total Protein: 6.8 g/dL (ref 6.5–8.1)

## 2016-12-07 LAB — CBC WITH DIFFERENTIAL/PLATELET
BASOS ABS: 0 10*3/uL (ref 0.0–0.1)
BASOS PCT: 0 %
EOS PCT: 0 %
Eosinophils Absolute: 0 10*3/uL (ref 0.0–0.7)
HCT: 44.5 % (ref 36.0–46.0)
Hemoglobin: 14.8 g/dL (ref 12.0–15.0)
LYMPHS PCT: 13 %
Lymphs Abs: 1.5 10*3/uL (ref 0.7–4.0)
MCH: 30.9 pg (ref 26.0–34.0)
MCHC: 33.3 g/dL (ref 30.0–36.0)
MCV: 92.9 fL (ref 78.0–100.0)
MONO ABS: 0.6 10*3/uL (ref 0.1–1.0)
Monocytes Relative: 5 %
Neutro Abs: 9.5 10*3/uL — ABNORMAL HIGH (ref 1.7–7.7)
Neutrophils Relative %: 82 %
PLATELETS: 181 10*3/uL (ref 150–400)
RBC: 4.79 MIL/uL (ref 3.87–5.11)
RDW: 13.1 % (ref 11.5–15.5)
WBC: 11.7 10*3/uL — ABNORMAL HIGH (ref 4.0–10.5)

## 2016-12-07 LAB — ETHANOL: ALCOHOL ETHYL (B): 129 mg/dL — AB (ref <5)

## 2016-12-07 LAB — SALICYLATE LEVEL

## 2016-12-07 LAB — I-STAT BETA HCG BLOOD, ED (MC, WL, AP ONLY)

## 2016-12-07 LAB — ACETAMINOPHEN LEVEL

## 2016-12-07 MED ORDER — SODIUM CHLORIDE 0.9 % IV BOLUS (SEPSIS)
1000.0000 mL | Freq: Once | INTRAVENOUS | Status: AC
  Administered 2016-12-07: 1000 mL via INTRAVENOUS

## 2016-12-07 NOTE — ED Triage Notes (Addendum)
Patient [place] with EMS from a [place] found unresponsive at the parking lot , received Narcan intranasal 0.5 x2 and Narcan 1 mg IV and regained consciousness prior to arrival , alert / somnolent at arrival . Pt. stated she took Heroin this evening.

## 2016-12-07 NOTE — ED Notes (Addendum)
Pt's. pocket knife , debit card , and pair of earrings sent to security for safekeeping .

## 2016-12-07 NOTE — ED Notes (Signed)
Patient belongings including utility knife, debit card, and earring secured with security personnel. Patient valuables envelope [id number]; security plastic valuables pouch [id number]. Receipt for belongings placed with patient.
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
## 2016-12-07 NOTE — ED Notes (Signed)
Dr. Silverio LayYao notified that pt. removed her IV , pulled monitor leads and pulse oximetry and left her room , ambulated the hallways and  requesting to leave .

## 2017-07-15 DIAGNOSIS — F17210 Nicotine dependence, cigarettes, uncomplicated: Secondary | ICD-10-CM | POA: Insufficient documentation

## 2017-07-15 DIAGNOSIS — R404 Transient alteration of awareness: Secondary | ICD-10-CM | POA: Diagnosis present

## 2017-07-15 DIAGNOSIS — T40601A Poisoning by unspecified narcotics, accidental (unintentional), initial encounter: Secondary | ICD-10-CM | POA: Insufficient documentation

## 2017-07-15 DIAGNOSIS — I10 Essential (primary) hypertension: Secondary | ICD-10-CM | POA: Insufficient documentation

## 2017-07-16 ENCOUNTER — Other Ambulatory Visit: Payer: Self-pay

## 2017-07-16 ENCOUNTER — Encounter (HOSPITAL_COMMUNITY): Payer: Self-pay

## 2017-07-16 ENCOUNTER — Emergency Department (HOSPITAL_COMMUNITY)
Admission: EM | Admit: 2017-07-16 | Discharge: 2017-07-16 | Disposition: A | Discharge status: Home / Self Care | Payer: Medicare Other | Attending: Emergency Medicine | Admitting: Emergency Medicine

## 2017-07-16 DIAGNOSIS — T40601A Poisoning by unspecified narcotics, accidental (unintentional), initial encounter: Secondary | ICD-10-CM

## 2017-07-16 NOTE — ED Notes (Signed)
Bed: RESA Expected date:  Expected time:  Means of arrival:  Comments: EMS opiate overdose
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
## 2017-07-16 NOTE — ED Notes (Signed)
Patient will arouse to name called and tactile stimulation-Dr. Blinda LeatherwoodPollina at bedside

## 2017-10-05 ENCOUNTER — Emergency Department (HOSPITAL_COMMUNITY)
Admission: EM | Admit: 2017-10-05 | Discharge: 2017-10-05 | Discharge status: Absent without leave | Payer: Medicare Other

## 2017-10-05 NOTE — ED Notes (Signed)
BROUGHT IN BY [facility], PT AMBULATED TO TRIAGE AFTER USING RESTROOM, PT STATES THAT SHE DOESN'T WANT TO STAY AND LEFT TRIAGE AND OUT LOBBY DOORS.

## 2020-01-05 ENCOUNTER — Ambulatory Visit: Payer: Medicare Other | Admitting: Family Medicine

## 2020-01-06 ENCOUNTER — Ambulatory Visit (INDEPENDENT_AMBULATORY_CARE_PROVIDER_SITE_OTHER): Payer: Medicare Other | Admitting: Family Medicine

## 2020-01-06 ENCOUNTER — Other Ambulatory Visit: Payer: Self-pay

## 2020-01-06 ENCOUNTER — Encounter: Payer: Self-pay | Admitting: Family Medicine

## 2020-01-06 VITALS — BP 128/80 | HR 79 | Ht 63.0 in | Wt 298.6 lb

## 2020-01-06 DIAGNOSIS — K732 Chronic active hepatitis, not elsewhere classified: Secondary | ICD-10-CM | POA: Insufficient documentation

## 2020-01-06 DIAGNOSIS — G43909 Migraine, unspecified, not intractable, without status migrainosus: Secondary | ICD-10-CM | POA: Insufficient documentation

## 2020-01-06 DIAGNOSIS — M79604 Pain in right leg: Secondary | ICD-10-CM

## 2020-01-06 DIAGNOSIS — B1710 Acute hepatitis C without hepatic coma: Secondary | ICD-10-CM

## 2020-01-06 DIAGNOSIS — F319 Bipolar disorder, unspecified: Secondary | ICD-10-CM | POA: Insufficient documentation

## 2020-01-06 DIAGNOSIS — M79605 Pain in left leg: Secondary | ICD-10-CM

## 2020-01-06 DIAGNOSIS — Z7689 Persons encountering health services in other specified circumstances: Secondary | ICD-10-CM

## 2020-01-06 LAB — POCT GLYCOSYLATED HEMOGLOBIN (HGB A1C): Hemoglobin A1C: 5.7 % — AB (ref 4.0–5.6)

## 2020-01-06 MED ORDER — IBUPROFEN 800 MG PO TABS: 800.0000 mg | ORAL_TABLET | Freq: Three times a day (TID) | ORAL | 0 refills | Status: DC

## 2020-01-06 NOTE — Patient Instructions (Addendum)
It was good to see you today.  Thank you for coming in.    We will try to obtain your previous health records which you signed a consent for.   We are obtaining labs- CBC, CMP, Hepatits C, A1C as routine labs.  If the results are nromal I will send you a letter.  If anything is abnormal I will call you.  I am prescribing 800 mg Ibuprofen for your leg pain.  Things we will follow-up in the future regarding- Hepatitis C, Weight Gain, Smoking Cessation, and Leg Pain.  You can obtain a COVID screen at your pharmacy.  Psychiatry Resource List (Adults and Children) Most of these providers will take Medicaid. please consult your insurance for a complete and updated list of available providers. When calling to make an appointment have your insurance information available to confirm you are covered.  [facility]:   [place]: [address]     [phone]   [place]: [address] [place],        [phone] [place]: [address],    [phone] [place]ne Sharper: [phone] [place],                   [phone] Children: [facility] [address]         [phone]   [facility]  (Psychiatry only; Adults /children 12 and over, will take Medicaid)  [address] Laurell Josephs [address], [place], [place] [address]       [phone]   [facility] (Psychiatry & counseling ; adults & children ; will take Medicaid [address]  [place] [address]   Go on-line to complete referral ( [email or web address] [phone]    (Spanish therapist)  [facility]  Psychiatry & counseling; Adults and children;  Call Registration prior to scheduling an appointment [phone] [address]    [place], [place] [address]    [phone]  [facility] (Psychiatry & counseling; adults & children; Medicare no  Medicaid)  [address]   [place], [place]  [address]      [phone]    [facility] (up to age 12)  Psychiatry & counseling ,will take Medicaid, must do counseling to receive psychiatry services  [address] [place] [address]        [phone]  [facility] (Psychiatry & counseling; adults & children; will take Medicaid) Will need a referral from provider [address],  [place], [place]  [phone]  [facility]---  Walk-in Mon-Fri, 8:30-5:00 (will take Medicaid)  [address], [place], [place]  [phone]  To schedule an appointment call [phone]  [facility] --- Walk-In Mon-Friday 8am-3pm ( will take Medicaid, Psychiatry, Adults & children,  [address], [place], [place]   [phone]   [facility], Walk-in M-F 8am-12pm and 1pm -3pm   (Counseling, Psychiatry, will take Medicaid, adults & children)  [address] [place], [place]  [phone]    Be Well, Jovita Kussmaul MD

## 2020-01-06 NOTE — Assessment & Plan Note (Signed)
Patient reports has Hepatitis C infection.   - Obtain labs to determine infection status and possible Viral Loads

## 2020-01-06 NOTE — Assessment & Plan Note (Signed)
Patient indicates Tylenol not helpful, but high dose Ibuprofen has helped in past - Prescribed Ibuprofen 800 mg TID - Will follow-up on back pain in 1 month
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
## 2020-01-08 LAB — CBC WITH DIFFERENTIAL/PLATELET
Basophils Absolute: 0 10*3/uL (ref 0.0–0.2)
Basos: 0 %
EOS (ABSOLUTE): 0.1 10*3/uL (ref 0.0–0.4)
Eos: 1 %
Hematocrit: 35.4 % (ref 34.0–46.6)
Hemoglobin: 11.8 g/dL (ref 11.1–15.9)
Immature Grans (Abs): 0.1 10*3/uL (ref 0.0–0.1)
Immature Granulocytes: 1 %
Lymphocytes Absolute: 2 10*3/uL (ref 0.7–3.1)
Lymphs: 13 %
MCH: 26.9 pg (ref 26.6–33.0)
MCHC: 33.3 g/dL (ref 31.5–35.7)
MCV: 81 fL (ref 79–97)
Monocytes Absolute: 1.3 10*3/uL — ABNORMAL HIGH (ref 0.1–0.9)
Monocytes: 8 %
Neutrophils Absolute: 12 10*3/uL — ABNORMAL HIGH (ref 1.4–7.0)
Neutrophils: 77 %
Platelets: 269 10*3/uL (ref 150–450)
RBC: 4.39 x10E6/uL (ref 3.77–5.28)
RDW: 13.9 % (ref 11.7–15.4)
WBC: 15.5 10*3/uL — ABNORMAL HIGH (ref 3.4–10.8)

## 2020-01-08 LAB — COMPREHENSIVE METABOLIC PANEL
ALT: 21 IU/L (ref 0–32)
AST: 27 IU/L (ref 0–40)
Albumin/Globulin Ratio: 1.3 (ref 1.2–2.2)
Albumin: 4.1 g/dL (ref 3.8–4.8)
Alkaline Phosphatase: 134 IU/L — ABNORMAL HIGH (ref 48–121)
BUN/Creatinine Ratio: 16 (ref 9–23)
BUN: 12 mg/dL (ref 6–24)
Bilirubin Total: 0.3 mg/dL (ref 0.0–1.2)
CO2: 26 mmol/L (ref 20–29)
Calcium: 9.4 mg/dL (ref 8.7–10.2)
Chloride: 98 mmol/L (ref 96–106)
Creatinine, Ser: 0.75 mg/dL (ref 0.57–1.00)
GFR calc Af Amer: 112 mL/min/{1.73_m2} (ref >59)
GFR calc non Af Amer: 97 mL/min/{1.73_m2} (ref >59)
Globulin, Total: 3.2 g/dL (ref 1.5–4.5)
Glucose: 100 mg/dL — ABNORMAL HIGH (ref 65–99)
Potassium: 4.3 mmol/L (ref 3.5–5.2)
Sodium: 137 mmol/L (ref 134–144)
Total Protein: 7.3 g/dL (ref 6.0–8.5)

## 2020-01-08 LAB — HCV RT-PCR, QUANT (NON-GRAPH)
HCV log10: 6.987 log10 IU/mL
Hepatitis C Quantitation: 9710000 IU/mL

## 2020-01-08 LAB — LIPID PANEL
Chol/HDL Ratio: 4.9 ratio — ABNORMAL HIGH (ref 0.0–4.4)
Cholesterol, Total: 252 mg/dL — ABNORMAL HIGH (ref 100–199)
HDL: 51 mg/dL (ref >39)
LDL Chol Calc (NIH): 183 mg/dL — ABNORMAL HIGH (ref 0–99)
Triglycerides: 102 mg/dL (ref 0–149)
VLDL Cholesterol Cal: 18 mg/dL (ref 5–40)

## 2020-01-08 LAB — HCV AB W REFLEX TO QUANT PCR: HCV Ab: >11 s/co ratio — ABNORMAL HIGH (ref 0.0–0.9)

## 2020-01-13 ENCOUNTER — Other Ambulatory Visit: Payer: Self-pay

## 2020-01-13 ENCOUNTER — Encounter: Payer: Self-pay | Admitting: Family Medicine

## 2020-01-13 ENCOUNTER — Ambulatory Visit (INDEPENDENT_AMBULATORY_CARE_PROVIDER_SITE_OTHER): Payer: Medicare Other | Admitting: Family Medicine

## 2020-01-13 ENCOUNTER — Ambulatory Visit: Payer: Self-pay | Admitting: Licensed Clinical Social Worker

## 2020-01-13 VITALS — BP 140/82 | HR 79 | Wt 297.0 lb

## 2020-01-13 DIAGNOSIS — E7800 Pure hypercholesterolemia, unspecified: Secondary | ICD-10-CM

## 2020-01-13 DIAGNOSIS — R768 Other specified abnormal immunological findings in serum: Secondary | ICD-10-CM

## 2020-01-13 DIAGNOSIS — K732 Chronic active hepatitis, not elsewhere classified: Secondary | ICD-10-CM | POA: Diagnosis not present

## 2020-01-13 DIAGNOSIS — I10 Essential (primary) hypertension: Secondary | ICD-10-CM

## 2020-01-13 DIAGNOSIS — M79605 Pain in left leg: Secondary | ICD-10-CM

## 2020-01-13 DIAGNOSIS — M79604 Pain in right leg: Secondary | ICD-10-CM | POA: Diagnosis not present

## 2020-01-13 DIAGNOSIS — M7989 Other specified soft tissue disorders: Secondary | ICD-10-CM

## 2020-01-13 MED ORDER — IBUPROFEN 800 MG PO TABS: 800.0000 mg | ORAL_TABLET | Freq: Three times a day (TID) | ORAL | 0 refills | Status: AC | PRN

## 2020-01-13 MED ORDER — ATORVASTATIN CALCIUM 40 MG PO TABS: 40.0000 mg | ORAL_TABLET | Freq: Every day | ORAL | 3 refills | Status: AC

## 2020-01-13 NOTE — Chronic Care Management (AMB) (Signed)
   Social Work  Care Management Consultation  01/13/2020 Name: Annalee Meyerhoff MRN: [id number] DOB: Aug 24, 1975 Rickelle Sylvestre is a 44 y.o. year old female who sees Jovita Kussmaul, MD for primary care. LCSW was consulted by PCP due to elevated PCQ-9.   Assessment:  Patient reported no SI, is currently seeing psychiatry for medication management, is taking medication with no reported missed dose.  Patient meeting with her counselor weekly. Increased PHQ-9 scored related to medical concerns  Recommendation: After consultation with provider it is determined that patient may benefit from her continued treatment.   Intervention: Patient was not interviewed or contacted during this encounter.  LCSW collaborated with PCP .  Conducted brief assessment and provided recommendations. Relevant information and resources discussed with provider.   Plan:  1. No further follow up required by LCSW at this time 2. If further intervention is needed the care management team is available to follow up after a formal CCM referral is placed  3. Please consult with patient prior to making referral   Review of patient status, including review of consultants reports, relevant laboratory and other test results, and collaboration with appropriate care team members and the patient's provider was performed as part of comprehensive patient evaluation and provision of chronic care management services.      Sammuel Hines, LCSW Care Management & Coordination  [facility]   [phone] 3:33 PM

## 2020-01-13 NOTE — Patient Instructions (Signed)
It was good to see you today.  Thank you for coming in.  I am referring you to Infectious Disease for Hepatits C treatment.  I will also get Hepatitis B labs and HIV labs today.  I am also starting you on Atorvastatin 40 mg.    I believe your leg swelling may have to do with your weight.  You can try Compression Stockings to see if it helps with swelling.  I would like to see you again in 1 month for follow-up.  If your swelling gets significantly worse or if you have increased difficulty breathing please come back and see [place].  I will re-check your Blood Pressure at this time.  Be Well, Dr Drue Flirt

## 2020-01-16 DIAGNOSIS — M7989 Other specified soft tissue disorders: Secondary | ICD-10-CM | POA: Insufficient documentation

## 2020-01-16 NOTE — Assessment & Plan Note (Signed)
Patient BP elevated today at 140/83.  Significantly higher than last week.  Not currently on any medication for BP - Follow-up BP measurement in one month, may start anti-hypertensive therapy if elevated again

## 2020-01-16 NOTE — Progress Notes (Signed)
° ° °  SUBJECTIVE:   CHIEF COMPLAINT / HPI: F/u, Leg swelling  Holly Benjamin is a 44 yo female here for f/u for Hyperlipidemia and Hepatitis C.  She complains of leg swelling.  Indictaes it occurs in both legs equally.  Endorses difficulty breathing.  Denies any orthopnea.  Also endorses pain in feet, which along with the swelling becomes worse towards the end of the day.  No erythema o pruritis.  No decrease or increase in urination.  Felt like swelling improved when she took some of her Aunts Lasix.  No numbness or tinginling in legs.  No weakness or falls.    Patient scored 24 on PHQ-9.  Indicates all mood issues are due to current health problems. Sees Psychiatry and therapy every week through [facility].  Denies any SI or HI.  PERTINENT  PMH / PSH: Hypertension, Hypercholesterolemia, Bipolar Disorder, Morbid Obesity  OBJECTIVE:   BP (!) 140/82    Pulse 79    Wt (!) 297 lb (134.7 kg)    LMP 01/05/2020 (Exact Date)    SpO2 98%    BMI 52.61 kg/m    Physical Exam Constitutional:      General: She is not in acute distress.    Appearance: Normal appearance. She is obese. She is not ill-appearing.  HENT:     Head: Normocephalic and atraumatic.  Cardiovascular:     Rate and Rhythm: Normal rate and regular rhythm.  Pulmonary:     Effort: Pulmonary effort is normal.     Breath sounds: Normal breath sounds.  Musculoskeletal:        General: No tenderness or signs of injury.     Comments: Mild amount of non-pitting edema in feet  Skin:    Findings: No erythema or rash.  Neurological:     Mental Status: She is alert.     ASSESSMENT/PLAN:   Chronic active hepatitis (HCC) Patient found to have active infection on labs. - Referral to Infectious Disease for treatment - Obtain Hep B and HIV labs  HYPERTENSION, BENIGN ESSENTIAL Patient BP elevated today at 140/83.  Significantly higher than last week.  Not currently on any medication for BP - Follow-up BP measurement in  one month, may start anti-hypertensive therapy if elevated again  HYPERCHOLESTEROLEMIA Patient LDL in 180's.  Was previously on a statin. - Prescribed Atorvastatin 40 mg Therapy  Leg swelling Bilateral.  Mild non-pitting edema.  Likely due to Patient Morbid Obesity.  Less likely due to CHF, Fluid retention, DVT, or Liver Pathology. - Recommended Compression stockings - Will discuss weight loss strategies at next visit     Jovita Kussmaul, MD [facility]

## 2020-01-16 NOTE — Assessment & Plan Note (Signed)
Patient LDL in 180's.  Was previously on a statin. - Prescribed Atorvastatin 40 mg Therapy

## 2020-01-16 NOTE — Assessment & Plan Note (Addendum)
Patient found to have active infection on labs. - Referral to Infectious Disease for treatment - Obtain Hep B and HIV labs

## 2020-01-16 NOTE — Assessment & Plan Note (Addendum)
Bilateral.  Mild non-pitting edema.  Likely due to Patient Morbid Obesity.  Less likely due to CHF, Fluid retention, DVT, or Liver Pathology. - Recommended Compression stockings - Will discuss weight loss strategies at next visit

## 2020-01-22 ENCOUNTER — Encounter: Payer: Medicare Other | Admitting: Infectious Diseases

## 2020-02-10 ENCOUNTER — Ambulatory Visit: Payer: Medicare Other | Admitting: Family Medicine

## 2020-02-27 ENCOUNTER — Encounter: Payer: Medicare Other | Admitting: Infectious Diseases

## 2020-04-14 ENCOUNTER — Ambulatory Visit (HOSPITAL_COMMUNITY)
Admission: EM | Admit: 2020-04-14 | Discharge: 2020-04-14 | Disposition: A | Discharge status: Home / Self Care | Payer: Medicare Other | Attending: Emergency Medicine | Admitting: Emergency Medicine

## 2020-04-14 ENCOUNTER — Other Ambulatory Visit: Payer: Self-pay

## 2020-04-14 ENCOUNTER — Encounter (HOSPITAL_COMMUNITY): Payer: Self-pay

## 2020-04-14 DIAGNOSIS — Z20822 Contact with and (suspected) exposure to covid-19: Secondary | ICD-10-CM | POA: Diagnosis not present

## 2020-04-14 DIAGNOSIS — H66001 Acute suppurative otitis media without spontaneous rupture of ear drum, right ear: Secondary | ICD-10-CM | POA: Diagnosis present

## 2020-04-14 LAB — SARS CORONAVIRUS 2 (TAT 6-24 HRS): SARS Coronavirus 2: NEGATIVE

## 2020-04-14 MED ORDER — AMOXICILLIN-POT CLAVULANATE 875-125 MG PO TABS: 1.0000 | ORAL_TABLET | Freq: Two times a day (BID) | ORAL | 0 refills | Status: AC

## 2020-04-14 MED ORDER — NAPROXEN 500 MG PO TABS: 500.0000 mg | ORAL_TABLET | Freq: Two times a day (BID) | ORAL | 0 refills | Status: AC

## 2020-04-14 NOTE — Discharge Instructions (Signed)
Push fluids to ensure adequate hydration and keep secretions thin.  Complete course of antibiotics.  Mucinex as an expectorant.  Naproxen twice a day as needed, take with food.  If symptoms worsen or do not improve in the next week to return to be seen or to follow up with your PCP.

## 2020-04-14 NOTE — ED Triage Notes (Signed)
Pt presents with bilateral ear pain, congestion, non productive cough,and loss of taste & smell X 5 days.
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
## 2020-09-13 ENCOUNTER — Emergency Department (HOSPITAL_COMMUNITY)
Admission: EM | Admit: 2020-09-13 | Discharge: 2020-09-14 | Disposition: A | Discharge status: Home / Self Care | Payer: Medicare Other | Attending: Emergency Medicine | Admitting: Emergency Medicine

## 2020-09-13 ENCOUNTER — Other Ambulatory Visit: Payer: Self-pay

## 2020-09-13 DIAGNOSIS — Z79899 Other long term (current) drug therapy: Secondary | ICD-10-CM | POA: Insufficient documentation

## 2020-09-13 DIAGNOSIS — I10 Essential (primary) hypertension: Secondary | ICD-10-CM | POA: Insufficient documentation

## 2020-09-13 DIAGNOSIS — T401X1A Poisoning by heroin, accidental (unintentional), initial encounter: Secondary | ICD-10-CM | POA: Diagnosis not present

## 2020-09-13 DIAGNOSIS — F17210 Nicotine dependence, cigarettes, uncomplicated: Secondary | ICD-10-CM | POA: Diagnosis not present

## 2020-09-13 DIAGNOSIS — T50901A Poisoning by unspecified drugs, medicaments and biological substances, accidental (unintentional), initial encounter: Secondary | ICD-10-CM

## 2020-09-13 LAB — I-STAT BETA HCG BLOOD, ED (MC, WL, AP ONLY): I-stat hCG, quantitative: <5 m[IU]/mL (ref <5)

## 2020-09-13 MED ORDER — SODIUM CHLORIDE 0.9 % IV BOLUS
1000.0000 mL | Freq: Once | INTRAVENOUS | Status: AC
  Administered 2020-09-13: 1000 mL via INTRAVENOUS
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
## 2020-09-13 NOTE — ED Notes (Signed)
Holly Benjamin [phone] cousin states she is only family around, I told family member we will have pt cal once she wakes up.
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
## 2020-09-14 LAB — RAPID URINE DRUG SCREEN, HOSP PERFORMED
Amphetamines: NOT DETECTED
Barbiturates: NOT DETECTED
Benzodiazepines: NOT DETECTED
Cocaine: POSITIVE — AB
Opiates: NOT DETECTED
Tetrahydrocannabinol: NOT DETECTED

## 2020-09-14 LAB — CBC WITH DIFFERENTIAL/PLATELET
Abs Immature Granulocytes: 0.32 10*3/uL — ABNORMAL HIGH (ref 0.00–0.07)
Basophils Absolute: 0.1 10*3/uL (ref 0.0–0.1)
Basophils Relative: 0 %
Eosinophils Absolute: 0 10*3/uL (ref 0.0–0.5)
Eosinophils Relative: 0 %
HCT: 44.2 % (ref 36.0–46.0)
Hemoglobin: 14.4 g/dL (ref 12.0–15.0)
Immature Granulocytes: 1 %
Lymphocytes Relative: 7 %
Lymphs Abs: 1.7 10*3/uL (ref 0.7–4.0)
MCH: 29.3 pg (ref 26.0–34.0)
MCHC: 32.6 g/dL (ref 30.0–36.0)
MCV: 90 fL (ref 80.0–100.0)
Monocytes Absolute: 1.7 10*3/uL — ABNORMAL HIGH (ref 0.1–1.0)
Monocytes Relative: 7 %
Neutro Abs: 20.7 10*3/uL — ABNORMAL HIGH (ref 1.7–7.7)
Neutrophils Relative %: 85 %
Platelets: 262 10*3/uL (ref 150–400)
RBC: 4.91 MIL/uL (ref 3.87–5.11)
RDW: 15 % (ref 11.5–15.5)
WBC: 24.5 10*3/uL — ABNORMAL HIGH (ref 4.0–10.5)
nRBC: 0 % (ref 0.0–0.2)

## 2020-09-14 LAB — COMPREHENSIVE METABOLIC PANEL
ALT: 68 U/L — ABNORMAL HIGH (ref 0–44)
AST: 69 U/L — ABNORMAL HIGH (ref 15–41)
Albumin: 3.4 g/dL — ABNORMAL LOW (ref 3.5–5.0)
Alkaline Phosphatase: 102 U/L (ref 38–126)
Anion gap: 6 (ref 5–15)
BUN: 9 mg/dL (ref 6–20)
CO2: 26 mmol/L (ref 22–32)
Calcium: 8.7 mg/dL — ABNORMAL LOW (ref 8.9–10.3)
Chloride: 102 mmol/L (ref 98–111)
Creatinine, Ser: 1.14 mg/dL — ABNORMAL HIGH (ref 0.44–1.00)
GFR, Estimated: >60 mL/min (ref >60)
Glucose, Bld: 131 mg/dL — ABNORMAL HIGH (ref 70–99)
Potassium: 4.8 mmol/L (ref 3.5–5.1)
Sodium: 134 mmol/L — ABNORMAL LOW (ref 135–145)
Total Bilirubin: 0.5 mg/dL (ref 0.3–1.2)
Total Protein: 7.6 g/dL (ref 6.5–8.1)

## 2020-09-14 LAB — URINALYSIS, ROUTINE W REFLEX MICROSCOPIC
Bilirubin Urine: NEGATIVE
Glucose, UA: >=500 mg/dL — AB
Hgb urine dipstick: NEGATIVE
Ketones, ur: NEGATIVE mg/dL
Leukocytes,Ua: NEGATIVE
Nitrite: NEGATIVE
Protein, ur: 30 mg/dL — AB
Specific Gravity, Urine: 1.007 (ref 1.005–1.030)
pH: 5 (ref 5.0–8.0)

## 2020-09-14 LAB — ETHANOL: Alcohol, Ethyl (B): <10 mg/dL (ref <10)

## 2020-09-14 NOTE — ED Notes (Signed)
Nandita Mathenia (cousin) called for update [phone], pt legtharic at this time, verbal consent will be obtain once pt is awake.

## 2020-09-14 NOTE — Discharge Instructions (Signed)
Follow-up with outpatient drug treatment centers as provided in the resource guide attached to this discharge summary.

## 2020-09-14 NOTE — ED Notes (Signed)
Pt ambulated self to pt restroom and made aware of need for urine specimen. Pt arousalable to voice and speak but remains lethargic.

## 2020-09-14 NOTE — ED Notes (Signed)
Pt provided meal tray per request 

## 2020-09-14 NOTE — ED Notes (Signed)
Pt gave verbal consent to give Barbee Mamula (cousin) update, and pt fiance' at bedside at this time.

## 2020-10-31 ENCOUNTER — Emergency Department (HOSPITAL_COMMUNITY)
Admission: EM | Admit: 2020-10-31 | Discharge: 2020-10-31 | Disposition: A | Discharge status: Home / Self Care | Payer: Medicare Other | Attending: Emergency Medicine | Admitting: Emergency Medicine

## 2020-10-31 ENCOUNTER — Other Ambulatory Visit: Payer: Self-pay

## 2020-10-31 ENCOUNTER — Encounter (HOSPITAL_COMMUNITY): Payer: Self-pay | Admitting: Emergency Medicine

## 2020-10-31 DIAGNOSIS — I10 Essential (primary) hypertension: Secondary | ICD-10-CM | POA: Diagnosis not present

## 2020-10-31 DIAGNOSIS — T401X1A Poisoning by heroin, accidental (unintentional), initial encounter: Secondary | ICD-10-CM | POA: Diagnosis present

## 2020-10-31 DIAGNOSIS — F17210 Nicotine dependence, cigarettes, uncomplicated: Secondary | ICD-10-CM | POA: Insufficient documentation

## 2020-10-31 DIAGNOSIS — Z79899 Other long term (current) drug therapy: Secondary | ICD-10-CM | POA: Diagnosis not present

## 2020-10-31 LAB — ETHANOL: Alcohol, Ethyl (B): 174 mg/dL — ABNORMAL HIGH (ref <10)

## 2020-10-31 MED ORDER — NALOXONE HCL 4 MG/0.1ML NA LIQD: 1.0000 | Freq: Once | NASAL | 0 refills | Status: AC

## 2020-10-31 NOTE — Discharge Instructions (Signed)
Return for any problem.  ?
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
## 2021-08-09 ENCOUNTER — Other Ambulatory Visit (HOSPITAL_COMMUNITY): Payer: Self-pay | Admitting: Registered Nurse

## 2021-08-09 DIAGNOSIS — R062 Wheezing: Secondary | ICD-10-CM

## 2023-10-29 ENCOUNTER — Ambulatory Visit: Admitting: Student in an Organized Health Care Education/Training Program

## 2024-04-24 ENCOUNTER — Encounter: Payer: Self-pay | Admitting: Sleep Medicine
